# Patient Record
Sex: Male | Born: 1935 | Race: White | Hispanic: No | Marital: Married | Smoking: Former smoker
Health system: Southern US, Community
[De-identification: ages and names within clinical notes are randomized; demographics above are authoritative.]

## PROBLEM LIST (undated history)

## (undated) DIAGNOSIS — M1990 Unspecified osteoarthritis, unspecified site: Secondary | ICD-10-CM

## (undated) DIAGNOSIS — N400 Enlarged prostate without lower urinary tract symptoms: Secondary | ICD-10-CM

## (undated) DIAGNOSIS — Z96649 Presence of unspecified artificial hip joint: Secondary | ICD-10-CM

## (undated) DIAGNOSIS — J189 Pneumonia, unspecified organism: Secondary | ICD-10-CM

## (undated) DIAGNOSIS — C439 Malignant melanoma of skin, unspecified: Secondary | ICD-10-CM

## (undated) DIAGNOSIS — E119 Type 2 diabetes mellitus without complications: Secondary | ICD-10-CM

## (undated) DIAGNOSIS — E785 Hyperlipidemia, unspecified: Secondary | ICD-10-CM

## (undated) DIAGNOSIS — C8300 Small cell B-cell lymphoma, unspecified site: Secondary | ICD-10-CM

## (undated) HISTORY — PX: JOINT REPLACEMENT: SHX530

## (undated) HISTORY — DX: Malignant melanoma of skin, unspecified: C43.9

## (undated) HISTORY — DX: Benign prostatic hyperplasia without lower urinary tract symptoms: N40.0

## (undated) HISTORY — PX: ABDOMINAL AORTIC ANEURYSM REPAIR: SUR1152

## (undated) HISTORY — DX: Presence of unspecified artificial hip joint: Z96.649

## (undated) HISTORY — DX: Type 2 diabetes mellitus without complications: E11.9

## (undated) HISTORY — DX: Hyperlipidemia, unspecified: E78.5

## (undated) HISTORY — DX: Unspecified osteoarthritis, unspecified site: M19.90

## (undated) HISTORY — PX: NO PAST SURGERIES: SHX2092

---

## 2005-01-02 ENCOUNTER — Ambulatory Visit: Payer: Self-pay | Admitting: Gastroenterology

## 2006-03-10 ENCOUNTER — Ambulatory Visit: Payer: Self-pay | Admitting: Orthopaedic Surgery

## 2006-03-10 ENCOUNTER — Other Ambulatory Visit: Payer: Self-pay

## 2006-03-11 ENCOUNTER — Ambulatory Visit: Payer: Self-pay | Admitting: Orthopaedic Surgery

## 2008-03-21 ENCOUNTER — Ambulatory Visit: Payer: Self-pay | Admitting: Vascular Surgery

## 2008-03-21 ENCOUNTER — Ambulatory Visit: Payer: Self-pay | Admitting: Internal Medicine

## 2008-03-30 ENCOUNTER — Ambulatory Visit: Payer: Self-pay | Admitting: Vascular Surgery

## 2008-04-05 ENCOUNTER — Inpatient Hospital Stay: Payer: Self-pay | Admitting: Vascular Surgery

## 2008-05-24 ENCOUNTER — Ambulatory Visit: Payer: Self-pay | Admitting: Vascular Surgery

## 2008-05-25 ENCOUNTER — Inpatient Hospital Stay: Payer: Self-pay | Admitting: Vascular Surgery

## 2008-08-22 ENCOUNTER — Ambulatory Visit: Payer: Self-pay | Admitting: Gastroenterology

## 2011-02-03 ENCOUNTER — Ambulatory Visit: Payer: Self-pay | Admitting: General Surgery

## 2011-02-10 ENCOUNTER — Ambulatory Visit: Payer: Self-pay | Admitting: General Surgery

## 2012-08-26 ENCOUNTER — Ambulatory Visit: Payer: Self-pay | Admitting: Vascular Surgery

## 2012-08-26 LAB — BASIC METABOLIC PANEL
BUN: 18 mg/dL (ref 7–18)
Creatinine: 0.92 mg/dL (ref 0.60–1.30)
EGFR (African American): >60
EGFR (Non-African Amer.): >60
Glucose: 151 mg/dL — ABNORMAL HIGH (ref 65–99)
Osmolality: 277 (ref 275–301)
Potassium: 4.1 mmol/L (ref 3.5–5.1)
Sodium: 136 mmol/L (ref 136–145)

## 2012-09-08 ENCOUNTER — Ambulatory Visit: Payer: Self-pay | Admitting: Vascular Surgery

## 2012-09-08 LAB — BASIC METABOLIC PANEL
Anion Gap: 5 — ABNORMAL LOW (ref 7–16)
Chloride: 101 mmol/L (ref 98–107)
Co2: 31 mmol/L (ref 21–32)
Creatinine: 0.9 mg/dL (ref 0.60–1.30)
EGFR (Non-African Amer.): >60

## 2012-12-07 ENCOUNTER — Ambulatory Visit: Payer: Self-pay | Admitting: Unknown Physician Specialty

## 2013-03-15 ENCOUNTER — Ambulatory Visit: Payer: Self-pay | Admitting: Unknown Physician Specialty

## 2013-06-09 DIAGNOSIS — M659 Synovitis and tenosynovitis, unspecified: Secondary | ICD-10-CM | POA: Insufficient documentation

## 2013-12-19 DIAGNOSIS — Z87898 Personal history of other specified conditions: Secondary | ICD-10-CM | POA: Insufficient documentation

## 2014-04-06 DIAGNOSIS — M5440 Lumbago with sciatica, unspecified side: Secondary | ICD-10-CM | POA: Insufficient documentation

## 2014-04-06 DIAGNOSIS — Z96649 Presence of unspecified artificial hip joint: Secondary | ICD-10-CM | POA: Insufficient documentation

## 2014-04-14 ENCOUNTER — Ambulatory Visit
Admit: 2014-04-14 | Disposition: A | Discharge status: Home / Self Care | Payer: Self-pay | Attending: Unknown Physician Specialty | Admitting: Unknown Physician Specialty

## 2014-04-19 ENCOUNTER — Ambulatory Visit
Admit: 2014-04-19 | Disposition: A | Discharge status: Home / Self Care | Payer: Self-pay | Attending: Oncology | Admitting: Oncology

## 2014-04-20 LAB — CBC CANCER CENTER
BASOS ABS: 0.1 x10 3/mm (ref 0.0–0.1)
Basophil %: 0.6 %
EOS ABS: 1 x10 3/mm — AB (ref 0.0–0.7)
Eosinophil %: 10.1 %
HCT: 42.5 % (ref 40.0–52.0)
HGB: 14.1 g/dL (ref 13.0–18.0)
LYMPHS ABS: 1.1 x10 3/mm (ref 1.0–3.6)
Lymphocyte %: 11.1 %
MCH: 28.8 pg (ref 26.0–34.0)
MCHC: 33.3 g/dL (ref 32.0–36.0)
MCV: 87 fL (ref 80–100)
MONOS PCT: 8.5 %
Monocyte #: 0.8 x10 3/mm (ref 0.2–1.0)
NEUTROS PCT: 69.7 %
Neutrophil #: 6.7 x10 3/mm — ABNORMAL HIGH (ref 1.4–6.5)
PLATELETS: 234 x10 3/mm (ref 150–440)
RBC: 4.91 10*6/uL (ref 4.40–5.90)
RDW: 14.5 % (ref 11.5–14.5)
WBC: 9.6 x10 3/mm (ref 3.8–10.6)

## 2014-04-20 LAB — COMPREHENSIVE METABOLIC PANEL
AST: 24 U/L
Albumin: 3.4 g/dL — ABNORMAL LOW
Alkaline Phosphatase: 96 U/L
Anion Gap: 6 — ABNORMAL LOW (ref 7–16)
BUN: 22 mg/dL — ABNORMAL HIGH
Bilirubin,Total: 0.5 mg/dL
CALCIUM: 8.6 mg/dL — AB
CHLORIDE: 103 mmol/L
CO2: 28 mmol/L
Creatinine: 0.73 mg/dL
EGFR (Non-African Amer.): >60
GLUCOSE: 172 mg/dL — AB
Potassium: 4.9 mmol/L
SGPT (ALT): 18 U/L
SODIUM: 137 mmol/L
Total Protein: 6.6 g/dL

## 2014-04-21 LAB — URINE IEP, RANDOM

## 2014-04-24 LAB — PROT IMMUNOELECTROPHORES(ARMC)

## 2014-04-24 LAB — CANCER ANTIGEN 19-9: CA 19-9: 4 U/mL (ref 0–35)

## 2014-04-24 LAB — CEA: CEA: 3.5 ng/mL (ref 0.0–4.7)

## 2014-04-24 LAB — PSA: PSA: 4 ng/mL (ref 0.0–4.0)

## 2014-05-05 NOTE — Op Note (Signed)
PATIENT NAME:  David Beck, David Beck MR#:  [id number] DATE OF BIRTH:  06-25-1920  DATE OF PROCEDURE:  08/26/2012  PREOPERATIVE DIAGNOSES:  1.  Peripheral arterial disease with short distance claudication of the left lower extremity.  2.  Abdominal aortic aneurysm status post previous endovascular repair.  3.  Diabetes.  4.  Hypertension.   POSTOPERATIVE DIAGNOSES: 1.  Peripheral arterial disease with short distance claudication of the left lower extremity.  2.  Abdominal aortic aneurysm status post previous endovascular repair.  3.  Diabetes.  4.  Hypertension.   PROCEDURES:  1.  Ultrasound guidance for vascular access to the left radial artery.  2.  Catheter placement to left superficial femoral artery from right femoral approach.  3.  Aortogram and selective left lower extremity angiogram.  4.  Percutaneous transluminal angioplasty of proximal to mid left superficial femoral artery stenosis with a 5-mm diameter angioplasty balloon.   SURGEON:  Algernon Huxley, M.D.   ANESTHESIA:  Local with moderate conscious sedation.   ESTIMATED BLOOD LOSS:  25 mL.   INDICATION FOR PROCEDURE:  This is an 81 year old white male with worsening left lower extremity pain with minimal activity. He had previously had some claudication of his left lower extremity but over the past couple of weeks his pain has gotten worse and more severe. Noninvasive study showed a left SFA disease. Due to his previous abdominal aortic aneurysm repair, angiogram is going to be performed from a left upper extremity approach for further evaluation and potential treatment of his lesion. The risks and benefits were discussed. Informed consent was obtained.   DESCRIPTION OF THE PROCEDURE:  The patient was brought to the Vascular Interventional Radiology Suite. The left upper extremity is sterilely prepped and draped and a sterile surgical field was created. The left radial artery was accessed under direct ultrasound guidance in the mid  forearm with a micropuncture needle and a permanent image was recorded. A micropuncture wire and sheath were placed and upsized to a 5-French sheath. A pigtail catheter was then advanced, taken down the descending thoracic aorta and into the abdominal aorta just above the previously-placed stent graft. Aortogram was performed which showed a patent stent graft without flow-limiting stenosis in the aortoiliac segments. The renal arteries were patent. I then took a [place] advantage wire and advanced down to the left femoral head with a 135-cm angled catheter. This demonstrated stenosis of the proximal superficial femoral artery. There was then a reasonably normal segment of the SFA and then a second stenosis that was over about an 8 to 10 cm span and then resulted in a short segment occlusion. The distal superficial femoral artery then reconstituted. There appeared to be a moderate stenosis in the popliteal artery at the knee. A limited runoff distally had a normal tibial trifurcation. The 135 catheter got into the proximal superficial femoral artery. We then exchanged for an 0.018 wire. I was able to advance beyond the initial stenosis in the SFA but with the 150 cm catheter and 0.018 wire, I was not able to cross the total occlusion of the mid to distal superficial femoral artery. It was clear that this option was going to be futile, and we did not have stents that would reach that long had we crossed this lesion, only balloons, and with the heavily calcified lesion, even had I crossed this, I am not sure we could have provided revascularization options. I did elect to treat the more proximal lesion in the superficial femoral artery and  a 5-mm diameter angioplasty balloon was inflated in the proximal mid superficial femoral artery. A waist was taken, which resolved, and this initial stenosis was improved with angioplasty. The collateralization may have been improved by the stenosis, but I was unable to cross the  distal occlusion, and if he continues to have pain, we will have to come back from a different approach to treat his left lower extremity. At this point, a 4-0 Monocryl pursestring suture was placed around the sheath. The sheath was removed. Pressure was held. A sterile dressing was placed. The patient tolerated the procedure well and was taken to the Recovery Room in stable condition guidance.   ____________________________ Algernon Huxley, MD jsd:jm D: 08/26/2012 09:56:30 ET T: 08/26/2012 10:20:41 ET JOB#: [id number]  cc: Algernon Huxley, MD, <Dictator> Dion Body, MD Algernon Huxley MD ELECTRONICALLY SIGNED 08/30/2012 16:08

## 2014-05-05 NOTE — Op Note (Signed)
PATIENT NAME:  David Beck, David Beck MR#:  [id number] DATE OF BIRTH:  [birth year]  DATE OF PROCEDURE:  09/08/2012  PREOPERATIVE DIAGNOSES: 1.  Peripheral arterial disease with claudication, left lower extremity.  2.  Status post abdominal aortic aneurysm repair with Gore endovascular stent-graft.   POSTOPERATIVE DIAGNOSES:  1.  Peripheral arterial disease with claudication, left lower extremity.  2.  Status post abdominal aortic aneurysm repair with Gore endovascular stent-graft.   PROCEDURES: 1.  Ultrasound guidance for vascular access to left femoral artery.  2.  Catheter placement to left popliteal artery from left femoral approach.  3.  Left lower extremity angiogram.  4.  Percutaneous transluminal angioplasty of left superficial femoral artery occlusion with 6 mm diameter angioplasty balloon.  5.  Placement of a 6 mm diameter x 12 cm length self-expanding stent post dilated to 6 and 7 mm for greater than 50% residual stenosis after initial angioplasty.   SURGEON: Algernon Huxley, MD   ANESTHESIA: Local with moderate conscious sedation.   ESTIMATED BLOOD LOSS: Approximately 25 mL.   INDICATION FOR PROCEDURE: This is an 81 year old white male with severe peripheral vascular disease. He has short distance claudication of the left lower extremity. We have previously done an angiogram of the left arm.  His mid to distal left SFA occlusion was not amenable to treatment from the left arm approach, and we are attempting to cross this today from an antegrade approach and left femoral access.  The risks and benefits were discussed. Informed consent was obtained.   DESCRIPTION OF PROCEDURE: The patient was brought to the vascular interventional radiology suite. Left groin was sterilely prepped and draped, and a sterile surgical field was created. The left femoral artery was visualized with ultrasound and accessed under direct ultrasound guidance with a micropuncture needle. A micropuncture wire and sheath  were placed.  We then up-sized to a 5-French sheath. Imaging was performed of the proximal SFA which was treated with angioplasty last week, had no flow-limiting stenosis residual.  The distal SFA had a short segment occlusion that reconstituted just above Hunter's canal. His popliteal artery appeared to have a less than 50% stenosis, and he then had 2-vessel runoff distally. The patient was systemically heparinized. I up-sized to a 6-French sheath, was able to cross the lesion with a Terumo Advantage Wire and a Kumpe catheter with a mild amount of difficulty, confirming intraluminal flow in the popliteal artery. I then replaced the wire. Balloon angioplasty was performed with a 6 mm diameter angioplasty balloon. There was a very tight waist and heavy calcific lesion at the area of occlusion. Angiogram following angioplasty showed high-grade residual stenosis. I treated this with a 6 mm diameter x 12 cm length self-expanding stent.  I had to use a high-pressure 6 and then a 7 mm diameter angioplasty balloon to expand this to have a less than 30% residual stenosis. At this point, there was good in-line flow and a good angiographic completion result, and I elected to terminate the procedure. The sheath was removed. Pressure was held. Sterile dressing was placed. The patient tolerated the procedure well and was taken to the recovery room in stable condition.   ____________________________ Algernon Huxley, MD jsd:cb D: 09/08/2012 13:31:43 ET T: 09/08/2012 17:36:32 ET JOB#: [id number]  cc: Algernon Huxley, MD, <Dictator> Algernon Huxley MD ELECTRONICALLY SIGNED 09/15/2012 16:11

## 2014-05-07 NOTE — Op Note (Signed)
PATIENT NAME:  David Beck, BERWICK MR#:  [id number] DATE OF BIRTH:  November 09, 1920  DATE OF PROCEDURE:  02/10/2011  PREOPERATIVE DIAGNOSIS: Malignant melanoma, left distal forearm.   POSTOPERATIVE DIAGNOSIS: Malignant melanoma, left distal forearm.   OPERATIVE PROCEDURE: Wide excision with full-thickness skin graft, sentinel node biopsy.   SURGEON: Robert Bellow, MD  ANESTHESIA: General by LMA under Dr. Ronelle Nigh, Marcaine 0.5% plain, 30 mL local infiltration.   ESTIMATED BLOOD LOSS: Minimal.   CLINICAL NOTE: This 81 year old male was recently identified with a 0.8 mm malignant melanoma of the left forearm. Deep and peripheral margins were positive for residual tumor. He was felt to be a candidate for wide excision. As it was thought possible that this lesion might be 1 mm or greater it was elected to do a sentinel node biopsy at the same time. The patient was injected with technetium sulfur colloid by Crisoforo Oxford, M.D. in radiology approximately 2-1/2 hours prior to the procedure. Lymphoscintigraphy showed uptake in one gland in the left axilla.   OPERATIVE NOTE: With the patient under adequate general anesthesia, the axilla and arm were prepped with ChloraPrep and sterilely draped. The axilla was approached first. Skin lines were running longitudinally over the area of the increased uptake and a 6 x 2.5 cm full thickness skin biopsy was completed and saved on a saline dampened sponge. The deep tissue was entered transversely and a large 1.5 cm mildly hot lymph node was identified. Palpation showed no other additional nodes and scanning showed no other areas of increased uptake. This was sent in formalin for routine histology. The wound was closed with running 2-0 Vicryl deep. The deep adipose layer was then approximated with running 3-0 Vicryl. The skin was mobilized for less than 1 cm circumferentially and then approximated with running 3-0 Vicryl in the deep dermal layer and running 4-0 Vicryl  subcuticular suture for the skin. Benzoin, Steri-Strips, Telfa, and Tegaderm dressing was then applied.   Attention was turned to the forearm. The previously completed shave biopsy showed a clean still slightly open wound. It was elected to take margins 1 cm back from this making a 2.4 cm wide under 6 cm long wound. This was incised sharply after infiltration with Marcaine which was also used in the axilla for postoperative analgesia. The tissue was extended down well into the adipose layer but did not include the investing fascia. Hemostasis was with electrocautery. The defatted skin graft taken from the axilla was then brought into the field and anchored circumferentially with interrupted 3-0 silk sutures. A single 3-0 Vicryl suture was used to anchor it to the deep tissue to better approximate the graft to the wound bed. Prior to placing the graft the area was copiously irrigated to remove as much of methylene blue which had been instilled to help identify the sentinel node at the beginning of the procedure. A total of 3 mL of methylene blue diluted 1:2 with normal saline was used for the injection. Skin staples were then used for final approximation. The graft was compressed against the deep tissue followed by a layer of Adaptic, saline dampened cotton balls and then a bolster from the 3-0 silk sutures was created. Fluff gauze followed by Kerlix and a light Ace wrap was applied.   Patient tolerated the procedure well and was taken to recovery in stable condition.   ____________________________ Robert Bellow, MD jwb:cms D: 02/10/2011 13:52:28 ET T: 02/10/2011 14:04:53 ET JOB#: [id number]  cc: Robert Bellow, MD, <Dictator> Lucianne Muss  Netty Starring, MD Rayvon Char Dasher, MD Annelyse Rey Amedeo Kinsman MD ELECTRONICALLY SIGNED 02/10/2011 14:41

## 2014-05-08 ENCOUNTER — Ambulatory Visit
Admit: 2014-05-08 | Disposition: A | Discharge status: Home / Self Care | Payer: Self-pay | Attending: Internal Medicine | Admitting: Internal Medicine

## 2014-05-09 ENCOUNTER — Other Ambulatory Visit: Payer: Self-pay

## 2014-05-09 ENCOUNTER — Ambulatory Visit
Admit: 2014-05-09 | Disposition: A | Discharge status: Home / Self Care | Payer: Self-pay | Attending: Internal Medicine | Admitting: Internal Medicine

## 2014-05-19 ENCOUNTER — Telehealth: Payer: Self-pay | Admitting: *Deleted

## 2014-05-19 NOTE — Telephone Encounter (Signed)
Increase duragesic to 2 patches. Dr. Grayland Ormond is waiting for a call back from Dr. Reuel Derby regarding path and need for additional biopsy.

## 2014-05-19 NOTE — Telephone Encounter (Signed)
Continue to have significant pain with duragesic and norco use. Can he increase dose of duragesic? Patient and wife deny signs of oversedation.

## 2014-05-23 ENCOUNTER — Encounter: Payer: Self-pay | Admitting: Oncology

## 2014-05-23 ENCOUNTER — Inpatient Hospital Stay: Payer: Medicare Other | Attending: Oncology | Admitting: Oncology

## 2014-05-23 VITALS — BP 88/57 | HR 71 | Temp 97.2°F | Resp 16 | Wt 160.3 lb

## 2014-05-23 DIAGNOSIS — Z85820 Personal history of malignant melanoma of skin: Secondary | ICD-10-CM

## 2014-05-23 DIAGNOSIS — Z79899 Other long term (current) drug therapy: Secondary | ICD-10-CM | POA: Insufficient documentation

## 2014-05-23 DIAGNOSIS — E119 Type 2 diabetes mellitus without complications: Secondary | ICD-10-CM | POA: Diagnosis not present

## 2014-05-23 DIAGNOSIS — M1990 Unspecified osteoarthritis, unspecified site: Secondary | ICD-10-CM | POA: Insufficient documentation

## 2014-05-23 DIAGNOSIS — M549 Dorsalgia, unspecified: Secondary | ICD-10-CM | POA: Diagnosis not present

## 2014-05-23 DIAGNOSIS — M899 Disorder of bone, unspecified: Secondary | ICD-10-CM | POA: Diagnosis not present

## 2014-05-23 DIAGNOSIS — Z7952 Long term (current) use of systemic steroids: Secondary | ICD-10-CM | POA: Diagnosis not present

## 2014-05-23 DIAGNOSIS — C439 Malignant melanoma of skin, unspecified: Secondary | ICD-10-CM

## 2014-05-23 DIAGNOSIS — Z8679 Personal history of other diseases of the circulatory system: Secondary | ICD-10-CM | POA: Diagnosis not present

## 2014-05-23 DIAGNOSIS — E785 Hyperlipidemia, unspecified: Secondary | ICD-10-CM | POA: Insufficient documentation

## 2014-05-23 DIAGNOSIS — N400 Enlarged prostate without lower urinary tract symptoms: Secondary | ICD-10-CM | POA: Insufficient documentation

## 2014-05-23 DIAGNOSIS — Z87891 Personal history of nicotine dependence: Secondary | ICD-10-CM

## 2014-05-23 DIAGNOSIS — L989 Disorder of the skin and subcutaneous tissue, unspecified: Secondary | ICD-10-CM | POA: Diagnosis not present

## 2014-05-23 DIAGNOSIS — Z808 Family history of malignant neoplasm of other organs or systems: Secondary | ICD-10-CM | POA: Insufficient documentation

## 2014-05-23 NOTE — Progress Notes (Signed)
Patient's hip pain has not improved much with the Fentanyl patch increased to 2 Q72H.

## 2014-05-24 ENCOUNTER — Other Ambulatory Visit: Payer: Self-pay | Admitting: Interventional Radiology

## 2014-05-24 ENCOUNTER — Ambulatory Visit
Admission: RE | Admit: 2014-05-24 | Discharge: 2014-05-24 | Disposition: A | Discharge status: Home / Self Care | Payer: Medicare Other | Source: Ambulatory Visit | Attending: Oncology | Admitting: Oncology

## 2014-05-24 ENCOUNTER — Encounter
Admission: RE | Admit: 2014-05-24 | Discharge: 2014-05-24 | Disposition: A | Discharge status: Home / Self Care | Payer: Medicare Other | Source: Ambulatory Visit | Attending: Oncology | Admitting: Oncology

## 2014-05-24 DIAGNOSIS — C439 Malignant melanoma of skin, unspecified: Secondary | ICD-10-CM | POA: Insufficient documentation

## 2014-05-24 MED ORDER — TECHNETIUM TC 99M MEDRONATE IV KIT
22.0000 | PACK | Freq: Once | INTRAVENOUS | Status: AC | PRN
Start: 2014-05-24 — End: 2014-05-24
  Administered 2014-05-24: 23.089 via INTRAVENOUS

## 2014-05-25 ENCOUNTER — Ambulatory Visit
Admission: RE | Admit: 2014-05-25 | Discharge: 2014-05-25 | Disposition: A | Discharge status: Home / Self Care | Payer: Medicare Other | Source: Ambulatory Visit | Attending: Oncology | Admitting: Oncology

## 2014-05-25 DIAGNOSIS — Z85820 Personal history of malignant melanoma of skin: Secondary | ICD-10-CM | POA: Diagnosis present

## 2014-05-25 DIAGNOSIS — M899 Disorder of bone, unspecified: Secondary | ICD-10-CM | POA: Insufficient documentation

## 2014-05-25 DIAGNOSIS — C439 Malignant melanoma of skin, unspecified: Secondary | ICD-10-CM

## 2014-05-25 HISTORY — DX: Pneumonia, unspecified organism: J18.9

## 2014-05-25 LAB — CBC
HEMATOCRIT: 43.8 % (ref 40.0–52.0)
HEMOGLOBIN: 14.1 g/dL (ref 13.0–18.0)
MCH: 27.7 pg (ref 26.0–34.0)
MCHC: 32.2 g/dL (ref 32.0–36.0)
MCV: 86.1 fL (ref 80.0–100.0)
Platelets: 192 10*3/uL (ref 150–440)
RBC: 5.09 MIL/uL (ref 4.40–5.90)
RDW: 14.7 % — ABNORMAL HIGH (ref 11.5–14.5)
WBC: 11.5 10*3/uL — ABNORMAL HIGH (ref 3.8–10.6)

## 2014-05-25 LAB — DIFFERENTIAL
BASOS PCT: 1 %
Basophils Absolute: 0.1 10*3/uL (ref 0–0.1)
EOS PCT: 3 %
Eosinophils Absolute: 0.3 10*3/uL (ref 0–0.7)
LYMPHS PCT: 4 %
Lymphs Abs: 0.4 10*3/uL — ABNORMAL LOW (ref 1.0–3.6)
MONO ABS: 0.8 10*3/uL (ref 0.2–1.0)
Monocytes Relative: 7 %
Neutro Abs: 10 10*3/uL — ABNORMAL HIGH (ref 1.4–6.5)
Neutrophils Relative %: 87 %

## 2014-05-25 LAB — APTT: aPTT: 27 seconds (ref 24–36)

## 2014-05-25 LAB — PROTIME-INR
INR: 1.06
PROTHROMBIN TIME: 14 s (ref 11.4–15.0)

## 2014-05-25 MED ORDER — HEPARIN SOD (PORK) LOCK FLUSH 100 UNIT/ML IV SOLN
500.0000 [IU] | Freq: Once | INTRAVENOUS | Status: DC
  Filled 2014-05-25: qty 5

## 2014-05-25 MED ORDER — MIDAZOLAM HCL 5 MG/5ML IJ SOLN
INTRAMUSCULAR | Status: AC | PRN
  Administered 2014-05-25: 0.5 mg via INTRAVENOUS
  Administered 2014-05-25: 1 mg via INTRAVENOUS
  Administered 2014-05-25: 0.5 mg via INTRAVENOUS

## 2014-05-25 MED ORDER — SODIUM CHLORIDE 0.9 % IV SOLN
INTRAVENOUS | Status: DC
  Administered 2014-05-25: 09:00:00 via INTRAVENOUS

## 2014-05-25 MED ORDER — HYDROCODONE-ACETAMINOPHEN 5-325 MG PO TABS: 1.0000 | ORAL_TABLET | ORAL | Status: DC | PRN

## 2014-05-25 MED ORDER — FENTANYL CITRATE (PF) 100 MCG/2ML IJ SOLN
INTRAMUSCULAR | Status: AC | PRN
  Administered 2014-05-25: 50 ug via INTRAVENOUS
  Administered 2014-05-25: 25 ug via INTRAVENOUS

## 2014-05-25 NOTE — Procedures (Signed)
Interventional Radiology Procedure Note  Procedure: CT guided aspirate and core biopsy of right iliac bone lesion Complications: None Recommendations: - Bedrest supine x 2 hrs - Hydrocodone PRN  Pain - Follow biopsy results  Signed,  Criselda Peaches, MD

## 2014-05-30 ENCOUNTER — Ambulatory Visit: Payer: Medicare Other | Admitting: Oncology

## 2014-05-30 ENCOUNTER — Ambulatory Visit: Admission: RE | Admit: 2014-05-30 | Payer: Medicare Other | Source: Ambulatory Visit

## 2014-05-31 LAB — SURGICAL PATHOLOGY

## 2014-06-01 ENCOUNTER — Other Ambulatory Visit: Payer: Self-pay | Admitting: *Deleted

## 2014-06-01 ENCOUNTER — Inpatient Hospital Stay (HOSPITAL_BASED_OUTPATIENT_CLINIC_OR_DEPARTMENT_OTHER): Payer: Medicare Other | Admitting: Oncology

## 2014-06-01 VITALS — BP 98/66 | HR 66 | Temp 96.5°F | Resp 20

## 2014-06-01 DIAGNOSIS — E785 Hyperlipidemia, unspecified: Secondary | ICD-10-CM | POA: Diagnosis not present

## 2014-06-01 DIAGNOSIS — E119 Type 2 diabetes mellitus without complications: Secondary | ICD-10-CM | POA: Diagnosis not present

## 2014-06-01 DIAGNOSIS — M899 Disorder of bone, unspecified: Secondary | ICD-10-CM | POA: Diagnosis not present

## 2014-06-01 DIAGNOSIS — Z85820 Personal history of malignant melanoma of skin: Secondary | ICD-10-CM

## 2014-06-01 DIAGNOSIS — Z7952 Long term (current) use of systemic steroids: Secondary | ICD-10-CM

## 2014-06-01 DIAGNOSIS — M549 Dorsalgia, unspecified: Secondary | ICD-10-CM | POA: Diagnosis not present

## 2014-06-01 DIAGNOSIS — Z79899 Other long term (current) drug therapy: Secondary | ICD-10-CM

## 2014-06-01 DIAGNOSIS — C8330 Diffuse large B-cell lymphoma, unspecified site: Secondary | ICD-10-CM

## 2014-06-01 DIAGNOSIS — M1990 Unspecified osteoarthritis, unspecified site: Secondary | ICD-10-CM

## 2014-06-01 DIAGNOSIS — Z8679 Personal history of other diseases of the circulatory system: Secondary | ICD-10-CM

## 2014-06-01 DIAGNOSIS — N400 Enlarged prostate without lower urinary tract symptoms: Secondary | ICD-10-CM

## 2014-06-01 MED ORDER — PREDNISONE 50 MG PO TABS: ORAL_TABLET | ORAL | Status: AC

## 2014-06-01 MED ORDER — OXYCODONE HCL 10 MG PO TABS: 10.0000 mg | ORAL_TABLET | Freq: Four times a day (QID) | ORAL | Status: DC | PRN

## 2014-06-02 NOTE — Patient Instructions (Signed)
Rituximab injection  What is this medicine?  RITUXIMAB (ri TUX i mab) is a monoclonal antibody. This medicine changes the way the body's immune system works. It is used commonly to treat non-Hodgkin's lymphoma and other conditions. In cancer cells, this drug targets a specific protein within cancer cells and stops the cancer cells from growing. It is also used to treat rhuematoid arthritis (RA). In RA, this medicine slow the inflammatory process and help reduce joint pain and swelling. This medicine is often used with other cancer or arthritis medications.  This medicine may be used for other purposes; ask your health care provider or pharmacist if you have questions.  COMMON BRAND NAME(S): Rituxan  What should I tell my health care provider before I take this medicine?  They need to know if you have any of these conditions:  -blood disorders  -heart disease  -history of hepatitis B  -infection (especially a virus infection such as chickenpox, cold sores, or herpes)  -irregular heartbeat  -kidney disease  -lung or breathing disease, like asthma  -lupus  -an unusual or allergic reaction to rituximab, mouse proteins, other medicines, foods, dyes, or preservatives  -pregnant or trying to get pregnant  -breast-feeding  How should I use this medicine?  This medicine is for infusion into a vein. It is administered in a hospital or clinic by a specially trained health care professional.  A special MedGuide will be given to you by the pharmacist with each prescription and refill. Be sure to read this information carefully each time.  Talk to your pediatrician regarding the use of this medicine in children. This medicine is not approved for use in children.  Overdosage: If you think you have taken too much of this medicine contact a poison control center or emergency room at once.  NOTE: This medicine is only for you. Do not share this medicine with others.  What if I miss a dose?  It is important not to miss a dose. Call  your doctor or health care professional if you are unable to keep an appointment.  What may interact with this medicine?  -cisplatin  -medicines for blood pressure  -some other medicines for arthritis  -vaccines  This list may not describe all possible interactions. Give your health care provider a list of all the medicines, herbs, non-prescription drugs, or dietary supplements you use. Also tell them if you smoke, drink alcohol, or use illegal drugs. Some items may interact with your medicine.  What should I watch for while using this medicine?  Report any side effects that you notice during your treatment right away, such as changes in your breathing, fever, chills, dizziness or lightheadedness. These effects are more common with the first dose.  Visit your prescriber or health care professional for checks on your progress. You will need to have regular blood work. Report any other side effects. The side effects of this medicine can continue after you finish your treatment. Continue your course of treatment even though you feel ill unless your doctor tells you to stop.  Call your doctor or health care professional for advice if you get a fever, chills or sore throat, or other symptoms of a cold or flu. Do not treat yourself. This drug decreases your body's ability to fight infections. Try to avoid being around people who are sick.  This medicine may increase your risk to bruise or bleed. Call your doctor or health care professional if you notice any unusual bleeding.  Be careful   brushing and flossing your teeth or using a toothpick because you may get an infection or bleed more easily. If you have any dental work done, tell your dentist you are receiving this medicine.  Avoid taking products that contain aspirin, acetaminophen, ibuprofen, naproxen, or ketoprofen unless instructed by your doctor. These medicines may hide a fever.  Do not become pregnant while taking this medicine. Women should inform their doctor  if they wish to become pregnant or think they might be pregnant. There is a potential for serious side effects to an unborn child. Talk to your health care professional or pharmacist for more information. Do not breast-feed an infant while taking this medicine.  What side effects may I notice from receiving this medicine?  Side effects that you should report to your doctor or health care professional as soon as possible:  -allergic reactions like skin rash, itching or hives, swelling of the face, lips, or tongue  -low blood counts - this medicine may decrease the number of white blood cells, red blood cells and platelets. You may be at increased risk for infections and bleeding.  -signs of infection - fever or chills, cough, sore throat, pain or difficulty passing urine  -signs of decreased platelets or bleeding - bruising, pinpoint red spots on the skin, black, tarry stools, blood in the urine  -signs of decreased red blood cells - unusually weak or tired, fainting spells, lightheadedness  -breathing problems  -confused, not responsive  -chest pain  -fast, irregular heartbeat  -feeling faint or lightheaded, falls  -mouth sores  -redness, blistering, peeling or loosening of the skin, including inside the mouth  -stomach pain  -swelling of the ankles, feet, or hands  -trouble passing urine or change in the amount of urine  Side effects that usually do not require medical attention (report to your doctor or other health care professional if they continue or are bothersome):  -anxiety  -headache  -loss of appetite  -muscle aches  -nausea  -night sweats  This list may not describe all possible side effects. Call your doctor for medical advice about side effects. You may report side effects to FDA at 1-800-FDA-1088.  Where should I keep my medicine?  This drug is given in a hospital or clinic and will not be stored at home.  NOTE: This sheet is a summary. It may not cover all possible information. If you have questions  about this medicine, talk to your doctor, pharmacist, or health care provider.  © 2015, [employer]. (2007-08-30 14:04:59)  Cyclophosphamide injection  What is this medicine?  CYCLOPHOSPHAMIDE (sye kloe FOSS fa mide) is a chemotherapy drug. It slows the growth of cancer cells. This medicine is used to treat many types of cancer like lymphoma, myeloma, leukemia, breast cancer, and ovarian cancer, to name a few.  This medicine may be used for other purposes; ask your health care provider or pharmacist if you have questions.  COMMON BRAND NAME(S): Cytoxan, Neosar  What should I tell my health care provider before I take this medicine?  They need to know if you have any of these conditions:  -blood disorders  -history of other chemotherapy  -infection  -kidney disease  -liver disease  -recent or ongoing radiation therapy  -tumors in the bone marrow  -an unusual or allergic reaction to cyclophosphamide, other chemotherapy, other medicines, foods, dyes, or preservatives  -pregnant or trying to get pregnant  -breast-feeding  How should I use this medicine?  This drug is usually given   as an injection into a vein or muscle or by infusion into a vein. It is administered in a hospital or clinic by a specially trained health care professional.  Talk to your pediatrician regarding the use of this medicine in children. Special care may be needed.  Overdosage: If you think you have taken too much of this medicine contact a poison control center or emergency room at once.  NOTE: This medicine is only for you. Do not share this medicine with others.  What if I miss a dose?  It is important not to miss your dose. Call your doctor or health care professional if you are unable to keep an appointment.  What may interact with this medicine?  This medicine may interact with the following medications:  -amiodarone  -amphotericin B  -azathioprine  -certain antiviral medicines for HIV or AIDS such as protease inhibitors (e.g.,  indinavir, ritonavir) and zidovudine  -certain blood pressure medications such as benazepril, captopril, enalapril, fosinopril, lisinopril, moexipril, monopril, perindopril, quinapril, ramipril, trandolapril  -certain cancer medications such as anthracyclines (e.g., daunorubicin, doxorubicin), busulfan, cytarabine, paclitaxel, pentostatin, tamoxifen, trastuzumab  -certain diuretics such as chlorothiazide, chlorthalidone, hydrochlorothiazide, indapamide, metolazone  -certain medicines that treat or prevent blood clots like warfarin  -certain muscle relaxants such as succinylcholine  -cyclosporine  -etanercept  -indomethacin  -medicines to increase blood counts like filgrastim, pegfilgrastim, sargramostim  -medicines used as general anesthesia  -metronidazole  -natalizumab  This list may not describe all possible interactions. Give your health care provider a list of all the medicines, herbs, non-prescription drugs, or dietary supplements you use. Also tell them if you smoke, drink alcohol, or use illegal drugs. Some items may interact with your medicine.  What should I watch for while using this medicine?  Visit your doctor for checks on your progress. This drug may make you feel generally unwell. This is not uncommon, as chemotherapy can affect healthy cells as well as cancer cells. Report any side effects. Continue your course of treatment even though you feel ill unless your doctor tells you to stop.  Drink water or other fluids as directed. Urinate often, even at night.  In some cases, you may be given additional medicines to help with side effects. Follow all directions for their use.  Call your doctor or health care professional for advice if you get a fever, chills or sore throat, or other symptoms of a cold or flu. Do not treat yourself. This drug decreases your body's ability to fight infections. Try to avoid being around people who are sick.  This medicine may increase your risk to bruise or bleed. Call  your doctor or health care professional if you notice any unusual bleeding.  Be careful brushing and flossing your teeth or using a toothpick because you may get an infection or bleed more easily. If you have any dental work done, tell your dentist you are receiving this medicine.  You may get drowsy or dizzy. Do not drive, use machinery, or do anything that needs mental alertness until you know how this medicine affects you.  Do not become pregnant while taking this medicine or for 1 year after stopping it. Women should inform their doctor if they wish to become pregnant or think they might be pregnant. Men should not father a child while taking this medicine and for 4 months after stopping it. There is a potential for serious side effects to an unborn child. Talk to your health care professional or pharmacist for more information.   Do not breast-feed an infant while taking this medicine.  This medicine may interfere with the ability to have a child. This medicine has caused ovarian failure in some women. This medicine has caused reduced sperm counts in some men. You should talk with your doctor or health care professional if you are concerned about your fertility.  If you are going to have surgery, tell your doctor or health care professional that you have taken this medicine.  What side effects may I notice from receiving this medicine?  Side effects that you should report to your doctor or health care professional as soon as possible:  -allergic reactions like skin rash, itching or hives, swelling of the face, lips, or tongue  -low blood counts - this medicine may decrease the number of white blood cells, red blood cells and platelets. You may be at increased risk for infections and bleeding.  -signs of infection - fever or chills, cough, sore throat, pain or difficulty passing urine  -signs of decreased platelets or bleeding - bruising, pinpoint red spots on the skin, black, tarry stools, blood in the  urine  -signs of decreased red blood cells - unusually weak or tired, fainting spells, lightheadedness  -breathing problems  -dark urine  -dizziness  -palpitations  -swelling of the ankles, feet, hands  -trouble passing urine or change in the amount of urine  -weight gain  -yellowing of the eyes or skin  Side effects that usually do not require medical attention (report to your doctor or health care professional if they continue or are bothersome):  -changes in nail or skin color  -hair loss  -missed menstrual periods  -mouth sores  -nausea, vomiting  This list may not describe all possible side effects. Call your doctor for medical advice about side effects. You may report side effects to FDA at 1-800-FDA-1088.  Where should I keep my medicine?  This drug is given in a hospital or clinic and will not be stored at home.  NOTE: This sheet is a summary. It may not cover all possible information. If you have questions about this medicine, talk to your doctor, pharmacist, or health care provider.  © 2015, [employer]. (2011-11-14 16:22:58)  Vincristine injection  What is this medicine?  VINCRISTINE (vin KRIS teen) is a chemotherapy drug. It slows the growth of cancer cells. This medicine is used to treat many types of cancer like Hodgkin's disease, leukemia, non-Hodgkin's lymphoma, neuroblastoma (brain cancer), rhabdomyosarcoma, and Wilms' tumor.  This medicine may be used for other purposes; ask your health care provider or pharmacist if you have questions.  COMMON BRAND NAME(S): Oncovin, Vincasar PFS  What should I tell my health care provider before I take this medicine?  They need to know if you have any of these conditions:  -blood disorders  -gout  -infection (especially chickenpox, cold sores, or herpes)  -kidney disease  -liver disease  -lung disease  -nervous system disease like Charcot-Marie-Tooth (CMT)  -recent or ongoing radiation therapy  -an unusual or allergic reaction to vincristine, other  chemotherapy agents, other medicines, foods, dyes, or preservatives  -pregnant or trying to get pregnant  -breast-feeding  How should I use this medicine?  This drug is given as an infusion into a vein. It is administered in a hospital or clinic by a specially trained health care professional. If you have pain, swelling, burning, or any unusual feeling around the site of your injection, tell your health care professional right away.  Talk to your   pediatrician regarding the use of this medicine in children. While this drug may be prescribed for selected conditions, precautions do apply.  Overdosage: If you think you have taken too much of this medicine contact a poison control center or emergency room at once.  NOTE: This medicine is only for you. Do not share this medicine with others.  What if I miss a dose?  It is important not to miss your dose. Call your doctor or health care professional if you are unable to keep an appointment.  What may interact with this medicine?  Do not take this medicine with any of the following medications:  -itraconazole  -mibefradil  -voriconazole  This medicine may also interact with the following medications:  -cyclosporine  -erythromycin  -fluconazole  -ketoconazole  -medicines for HIV like delavirdine, efavirenz, nevirapine  -medicines for seizures like ethotoin, fosphenotoin, phenytoin  -medicines to increase blood counts like filgrastim, pegfilgrastim, sargramostim  -other chemotherapy drugs like cisplatin, L-asparaginase, methotrexate, mitomycin, paclitaxel  -pegaspargase  -vaccines  -zalcitabine, ddC  Talk to your doctor or health care professional before taking any of these medicines:  -acetaminophen  -aspirin  -ibuprofen  -ketoprofen  -naproxen  This list may not describe all possible interactions. Give your health care provider a list of all the medicines, herbs, non-prescription drugs, or dietary supplements you use. Also tell them if you smoke, drink alcohol, or use  illegal drugs. Some items may interact with your medicine.  What should I watch for while using this medicine?  Your condition will be monitored carefully while you are receiving this medicine. You will need important blood work done while you are taking this medicine.  This drug may make you feel generally unwell. This is not uncommon, as chemotherapy can affect healthy cells as well as cancer cells. Report any side effects. Continue your course of treatment even though you feel ill unless your doctor tells you to stop.  In some cases, you may be given additional medicines to help with side effects. Follow all directions for their use.  Call your doctor or health care professional for advice if you get a fever, chills or sore throat, or other symptoms of a cold or flu. Do not treat yourself.  Avoid taking products that contain aspirin, acetaminophen, ibuprofen, naproxen, or ketoprofen unless instructed by your doctor. These medicines may hide a fever.  Do not become pregnant while taking this medicine. Women should inform their doctor if they wish to become pregnant or think they might be pregnant. There is a potential for serious side effects to an unborn child. Talk to your health care professional or pharmacist for more information. Do not breast-feed an infant while taking this medicine.  Men may have a lower sperm count while taking this medicine. Talk to your doctor if you plan to father a child.  What side effects may I notice from receiving this medicine?  Side effects that you should report to your doctor or health care professional as soon as possible:  -allergic reactions like skin rash, itching or hives, swelling of the face, lips, or tongue  -breathing problems  -confusion or changes in emotions or moods  -constipation  -cough  -mouth sores  -muscle weakness  -nausea and vomiting  -pain, swelling, redness or irritation at the injection site  -pain, tingling, numbness in the hands or feet  -problems  with balance, talking, walking  -seizures  -stomach pain  -trouble passing urine or change in the amount of urine  Side   effects that usually do not require medical attention (report to your doctor or health care professional if they continue or are bothersome):  -diarrhea  -hair loss  -jaw pain  -loss of appetite  This list may not describe all possible side effects. Call your doctor for medical advice about side effects. You may report side effects to FDA at 1-800-FDA-1088.  Where should I keep my medicine?  This drug is given in a hospital or clinic and will not be stored at home.  NOTE: This sheet is a summary. It may not cover all possible information. If you have questions about this medicine, talk to your doctor, pharmacist, or health care provider.  © 2015, [employer]. (2007-09-27 17:17:13)

## 2014-06-06 ENCOUNTER — Ambulatory Visit: Payer: Medicare Other

## 2014-06-06 LAB — CYTOLOGY - NON PAP

## 2014-06-07 ENCOUNTER — Telehealth: Payer: Self-pay | Admitting: *Deleted

## 2014-06-07 DIAGNOSIS — C8330 Diffuse large B-cell lymphoma, unspecified site: Secondary | ICD-10-CM | POA: Insufficient documentation

## 2014-06-07 NOTE — Telephone Encounter (Signed)
That should be ok, he can go ahead and take it.

## 2014-06-07 NOTE — Telephone Encounter (Signed)
Went to see PMD today who gave rx for Remeron for appetite and weight loss. They were reading info pon it and see that it causes decreased wbc and knowing that chemo is going to do the same was asking if he should take it and if not asking for something else be called in from you for appetite

## 2014-06-08 ENCOUNTER — Ambulatory Visit
Admission: RE | Admit: 2014-06-08 | Discharge: 2014-06-08 | Disposition: A | Discharge status: Home / Self Care | Payer: Medicare Other | Source: Ambulatory Visit | Attending: Vascular Surgery | Admitting: Vascular Surgery

## 2014-06-08 ENCOUNTER — Encounter
Admission: RE | Disposition: A | Discharge status: Home / Self Care | Payer: Self-pay | Source: Ambulatory Visit | Attending: Vascular Surgery

## 2014-06-08 ENCOUNTER — Encounter: Payer: Self-pay | Admitting: *Deleted

## 2014-06-08 ENCOUNTER — Other Ambulatory Visit: Payer: Medicare Other

## 2014-06-08 DIAGNOSIS — E785 Hyperlipidemia, unspecified: Secondary | ICD-10-CM | POA: Diagnosis not present

## 2014-06-08 DIAGNOSIS — Z87891 Personal history of nicotine dependence: Secondary | ICD-10-CM | POA: Diagnosis not present

## 2014-06-08 DIAGNOSIS — Z96649 Presence of unspecified artificial hip joint: Secondary | ICD-10-CM | POA: Insufficient documentation

## 2014-06-08 DIAGNOSIS — Z79899 Other long term (current) drug therapy: Secondary | ICD-10-CM | POA: Diagnosis not present

## 2014-06-08 DIAGNOSIS — E119 Type 2 diabetes mellitus without complications: Secondary | ICD-10-CM | POA: Diagnosis not present

## 2014-06-08 DIAGNOSIS — C8590 Non-Hodgkin lymphoma, unspecified, unspecified site: Secondary | ICD-10-CM | POA: Diagnosis present

## 2014-06-08 HISTORY — PX: PERIPHERAL VASCULAR CATHETERIZATION: SHX172C

## 2014-06-08 SURGERY — PORTA CATH INSERTION
Anesthesia: Moderate Sedation

## 2014-06-08 MED ORDER — CEFAZOLIN SODIUM 1-5 GM-% IV SOLN
INTRAVENOUS | Status: AC
  Filled 2014-06-08: qty 50

## 2014-06-08 MED ORDER — FENTANYL CITRATE (PF) 100 MCG/2ML IJ SOLN
INTRAMUSCULAR | Status: AC
  Filled 2014-06-08: qty 2

## 2014-06-08 MED ORDER — ATROPINE SULFATE 0.1 MG/ML IJ SOLN: 0.5000 mg | Freq: Once | INTRAMUSCULAR | Status: DC | PRN

## 2014-06-08 MED ORDER — DEXTROSE-NACL 5-0.9 % IV SOLN: INTRAVENOUS | Status: DC

## 2014-06-08 MED ORDER — CEFAZOLIN SODIUM 1-5 GM-% IV SOLN
1.0000 g | Freq: Once | INTRAVENOUS | Status: AC
  Administered 2014-06-08: 1 g via INTRAVENOUS

## 2014-06-08 MED ORDER — SODIUM CHLORIDE 0.9 % IR SOLN
80.0000 mg | Freq: Once | Status: AC
  Administered 2014-06-08: 80 mg
  Filled 2014-06-08: qty 2

## 2014-06-08 MED ORDER — MIDAZOLAM HCL 2 MG/2ML IJ SOLN
INTRAMUSCULAR | Status: DC | PRN
  Administered 2014-06-08: 1 mg via INTRAVENOUS
  Administered 2014-06-08: 2 mg via INTRAVENOUS

## 2014-06-08 MED ORDER — SODIUM CHLORIDE 0.9 % IV SOLN
INTRAVENOUS | Status: DC
  Administered 2014-06-08: 11:00:00 via INTRAVENOUS

## 2014-06-08 MED ORDER — ONDANSETRON HCL 4 MG/2ML IJ SOLN: 4.0000 mg | INTRAMUSCULAR | Status: DC | PRN

## 2014-06-08 MED ORDER — MIDAZOLAM HCL 5 MG/5ML IJ SOLN
INTRAMUSCULAR | Status: AC
  Filled 2014-06-08: qty 5

## 2014-06-08 MED ORDER — LIDOCAINE-EPINEPHRINE (PF) 1 %-1:200000 IJ SOLN
INTRAMUSCULAR | Status: AC
  Filled 2014-06-08: qty 30

## 2014-06-08 MED ORDER — HEPARIN (PORCINE) IN NACL 2-0.9 UNIT/ML-% IJ SOLN
INTRAMUSCULAR | Status: AC
  Filled 2014-06-08: qty 500

## 2014-06-08 MED ORDER — HYDROMORPHONE HCL 1 MG/ML IJ SOLN: 1.0000 mg | INTRAMUSCULAR | Status: DC | PRN

## 2014-06-08 MED ORDER — FENTANYL CITRATE (PF) 100 MCG/2ML IJ SOLN
INTRAMUSCULAR | Status: DC | PRN
  Administered 2014-06-08 (×2): 50 ug via INTRAVENOUS

## 2014-06-08 SURGICAL SUPPLY — 3 items
PACK ANGIOGRAPHY (CUSTOM PROCEDURE TRAY) ×3 IMPLANT
PORTACATH POWER 8F (Port) ×3 IMPLANT
TOWEL OR 17X26 4PK STRL BLUE (TOWEL DISPOSABLE) ×3 IMPLANT

## 2014-06-08 NOTE — H&P (Signed)
[facility] Admission History & Physical  MRN : [id number]  David Beck is a 81 y.o. (1920/03/17) male who presents with chief complaint of back and leg pain.  History of Present Illness: Patient is a 81 year old male who has a recently diagnosed malignancy. He needs a Port-A-Cath for durable chemotherapy and venous access. He is scheduled to start treatments soon. He complains of back and leg pain for several months to years now. He was found to have metastatic lesion in his spine that is likely causing some of the pain. He has a known history of aneurysm status post repair. He also has known peripheral vascular disease.  Current Facility-Administered Medications  Medication Dose Route Frequency Provider Last Rate Last Dose  . 0.9 %  sodium chloride infusion   Intravenous Continuous Algernon Huxley, MD 75 mL/hr at 06/08/14 1042    . atropine 0.1 MG/ML injection 0.5 mg  0.5 mg Intravenous Once PRN Algernon Huxley, MD      . ceFAZolin (ANCEF) IVPB 1 g/50 mL premix  1 g Intravenous Once Algernon Huxley, MD      . dextrose 5 %-0.9 % sodium chloride infusion   Intravenous Continuous Algernon Huxley, MD      . gentamicin (GARAMYCIN) 80 mg in sodium chloride irrigation 0.9 % 500 mL irrigation  80 mg Irrigation Once Algernon Huxley, MD      . HYDROmorphone (DILAUDID) injection 1 mg  1 mg Intravenous Q30 min PRN Algernon Huxley, MD      . ondansetron [facility]) injection 4 mg  4 mg Intravenous Q30 min PRN Algernon Huxley, MD        Past Medical History  Diagnosis Date  . Diabetes mellitus without complication   . DJD (degenerative joint disease)   . BPH (benign prostatic hyperplasia)   . Hyperlipidemia   . History of hip replacement   . Pneumonia   . Melanoma     Past Surgical History  Procedure Laterality Date  . No past surgeries    . Abdominal aortic aneurysm repair      4 years ago  . Joint replacement      Social History History  Substance Use Topics  . Smoking status: Former  Smoker    Types: Cigarettes    Quit date: 01/14/1978  . Smokeless tobacco: Not on file  . Alcohol Use: No    Family History Family History  Problem Relation Age of Onset  . Stomach cancer Mother   . Melanoma Brother   . Diabetes Brother   . CVA Brother     Allergies  Allergen Reactions  . No Known Allergies      REVIEW OF SYSTEMS (Negative unless checked)  Constitutional: [x] Weight loss  [] Fever  [] Chills Cardiac: [] Chest pain   [] Chest pressure   [] Palpitations   [] Shortness of breath when laying flat   [] Shortness of breath at rest   [] Shortness of breath with exertion. Vascular:  [x] Pain in legs with walking   [x] Pain in legs at rest   [] Pain in legs when laying flat   [] Claudication   [] Pain in feet when walking  [] Pain in feet at rest  [] Pain in feet when laying flat   [] History of DVT   [] Phlebitis   [] Swelling in legs   [] Varicose veins   [] Non-healing ulcers Pulmonary:   [] Uses home oxygen   [] Productive cough   [] Hemoptysis   [] Wheeze  [] COPD   [] Asthma Neurologic:  []   Dizziness  [] Blackouts   [] Seizures   [] History of stroke   [] History of TIA  [] Aphasia   [] Temporary blindness   [] Dysphagia   [] Weakness or numbness in arms   [] Weakness or numbness in legs Musculoskeletal:  [x] Arthritis   [] Joint swelling   [x] Joint pain   [x] Low back pain Hematologic:  [] Easy bruising  [] Easy bleeding   [] Hypercoagulable state   [] Anemic  [] Hepatitis Gastrointestinal:  [] Blood in stool   [] Vomiting blood  [] Gastroesophageal reflux/heartburn   [] Difficulty swallowing. Genitourinary:  [] Chronic kidney disease   [] Difficult urination  [] Frequent urination  [] Burning with urination   [] Blood in urine Skin:  [] Rashes   [] Ulcers   [] Wounds Psychological:  [] History of anxiety   []  History of major depression.  Physical Examination  Filed Vitals:   06/08/14 1034  BP: 94/72  Pulse: 83  Temp: 97.6 F (36.4 C)  Resp: 18  SpO2: 95%   There is no weight on file to calculate BMI.  Head:  [place]/AT. Prominent temp pulse not noted. Ear/Nose/Throat: Hearing grossly intact, nares w/o erythema or drainage, oropharynx w/o Erythema/Exudate,  Eyes: PERRLA, EOMI.  Neck: Supple, no nuchal rigidity.  No bruit or JVD.  Pulmonary:  Good air movement, clear to auscultation bilaterally, no use of accessory muscles.  Cardiac: RRR, normal S1, S2, no Murmurs, rubs or gallops. Vascular: Vessel Right Left  Radial Palpable Palpable  Ulnar Palpable Palpable  Brachial Palpable Palpable  Carotid Palpable, without bruit Palpable, without bruit  Aorta Not palpable N/A  Femoral Palpable Palpable  Popliteal Palpable  not Palpable  PT Palpable  weakly Palpable  DP  not Palpable  not Palpable   Gastrointestinal: soft, non-tender/non-distended. No guarding/reflex.  Musculoskeletal: M/S 5/5 throughout.  Extremities without ischemic changes.  No deformity or atrophy.  Neurologic: CN 2-12 intact. Pain and light touch intact in extremities.  Symmetrical.  Speech is fluent. Motor exam as listed above. Psychiatric: Judgment intact, Mood & affect appropriate for pt's clinical situation. Dermatologic: No rashes or ulcers noted.  No cellulitis or open wounds. Lymph : No Cervical, Axillary, or Inguinal lymphadenopathy.      CBC Lab Results  Component Value Date   WBC 11.5* 05/25/2014   HGB 14.1 05/25/2014   HCT 43.8 05/25/2014   MCV 86.1 05/25/2014   PLT 192 05/25/2014    BMET    Component Value Date/Time   NA 137 04/20/2014 1010   K 4.9 04/20/2014 1010   CL 103 04/20/2014 1010   CO2 28 04/20/2014 1010   GLUCOSE 172* 04/20/2014 1010   BUN 22* 04/20/2014 1010   CREATININE 0.73 04/20/2014 1010   CALCIUM 8.6* 04/20/2014 1010   GFRNONAA >60 04/20/2014 1010   GFRAA >60 04/20/2014 1010   CrCl cannot be calculated (Unknown ideal weight.).  COAG Lab Results  Component Value Date   INR 1.06 05/25/2014      Assessment/Plan Recently diagnosed malignancy with spinal metastases. Needs a  Port-A-Cath for chemotherapy and we will place this today. Risks and benefits discussed.  Abdominal aortic aneurysm, status post repair and stable. Peripheral arterial disease, stable. Back and leg pain, likely from metastatic malignancy. Arthritis PAD may be contributing factors although he does not describe typical claudication symptoms.   DEW,JASON, MD  06/08/2014 11:47 AM

## 2014-06-08 NOTE — Op Note (Signed)
Preoperative diagnosis:  1. Lymphoma, bone involvement in back  Postoperative diagnosis:  Same as above  Procedures: #1. Ultrasound guidance for vascular access to the right internal jugular vein. #2. Fluoroscopic guidance for placement of catheter. #3. Placement of CT compatible Port-A-Cath, right internal jugular vein.  Surgeon: Leotis Pain, MD.   Anesthesia: Local with moderate conscious sedation.  Fluoroscopy time: less than 1 minute  Contrast used: 0  Estimated blood loss: minimal  Indication for the procedure:  Patient is a 81 yo WM with recently diagnosed lymphoma.  The patient needs a Port-A-Cath for durable venous access, chemotherapy, lab draws, and CT scans. We are asked to place this. Risks and benefits were discussed and informed consent was obtained.  Description of procedure: The patient was brought to the vascular and interventional radiology suite. The right neck chest and shoulder were sterilely prepped and draped, and a sterile surgical field was created. Ultrasound was used to help visualize a patent right internal jugular vein. This was then accessed under direct ultrasound guidance without difficulty with the Seldinger needle and a permanent image was recorded. A J-wire was placed. After skin nick and dilatation, the peel-away sheath was then placed over the wire. I then anesthetized an area under the clavicle approximately 1-2 fingerbreadths. A transverse incision was created and an inferior pocket was created with electrocautery and blunt dissection. The port was then brought onto the field, placed into the pocket and secured to the chest wall with 2 Prolene sutures. The catheter was connected to the port and tunneled from the subclavicular incision to the access site. Fluoroscopic guidance was then used to cut the catheter to an appropriate length. The catheter was then placed through the peel-away sheath and the peel-away sheath was removed. The catheter tip was parked in  excellent location under fluorocoscopic guidance in the SVC just above the right atrium. The pocket was then irrigated with antibiotic impregnated saline and the wound was closed with a running 3-0 Vicryl and a 4-0 Monocryl. The access incision was closed with a single 4-0 Monocryl. The Huber needle was used to withdraw blood and flush the port with heparinized saline. Dermabond was then placed as a dressing. The patient tolerated the procedure well and was taken to the recovery room in stable condition.   DEW,JASON

## 2014-06-08 NOTE — Telephone Encounter (Signed)
Notified wife ok to take med

## 2014-06-09 ENCOUNTER — Ambulatory Visit: Payer: Medicare Other

## 2014-06-09 ENCOUNTER — Encounter: Payer: Self-pay | Admitting: Vascular Surgery

## 2014-06-09 NOTE — Progress Notes (Signed)
[facility]  Telephone:([phone]) [phone] Fax:(336) [phone]  ID: David Beck OB: 1920/08/20  MR#: [id number]  GEZ#:[id number]  Patient Care Team: Dion Body, MD as PCP - General (Family Medicine) Algernon Huxley, MD as Consulting Physician (Vascular Surgery)  CHIEF COMPLAINT:  Chief Complaint  Patient presents with  . Follow-up    Discuss test results    INTERVAL HISTORY: Patient returns to clinic today for further evaluation, discussion of his imaging results, and diagnosis planning. Recently EBUS results were inconclusive. Currently, patient feels well. His pain is improved on his current narcotic regimen. He has no neurologic complaints. He denies any recent fevers or illnesses. He has a good appetite and denies weight loss. He denies any dysphasia. He denies any chest pain, cough, or shortness of breath. He denies any nausea, vomiting, constipation, or diarrhea. He has no urinary complaints. Patient offers no further specific complaints today.  REVIEW OF SYSTEMS:   Review of Systems  Constitutional: Negative.   Respiratory: Negative.   Cardiovascular: Negative.   Musculoskeletal: Positive for back pain.    As per HPI. Otherwise, a complete review of systems is negatve.  PAST MEDICAL HISTORY: Past Medical History  Diagnosis Date  . Diabetes mellitus without complication   . DJD (degenerative joint disease)   . BPH (benign prostatic hyperplasia)   . Hyperlipidemia   . History of hip replacement   . Pneumonia   . Melanoma     PAST SURGICAL HISTORY: Past Surgical History  Procedure Laterality Date  . No past surgeries    . Abdominal aortic aneurysm repair      4 years ago  . Joint replacement    . Peripheral vascular catheterization N/A 06/08/2014    Procedure: Glori Luis Cath Insertion;  Surgeon: Algernon Huxley, MD;  Location: [place] [facility];  Service: Cardiovascular;  Laterality: N/A;    FAMILY HISTORY Family History  Problem Relation Age of  Onset  . Stomach cancer Mother   . Melanoma Brother   . Diabetes Brother   . CVA Brother        ADVANCED DIRECTIVES:    HEALTH MAINTENANCE: History  Substance Use Topics  . Smoking status: Former Smoker    Types: Cigarettes    Quit date: 01/14/1978  . Smokeless tobacco: Not on file  . Alcohol Use: No     Colonoscopy:  PAP:  Bone density:  Lipid panel:  Allergies  Allergen Reactions  . No Known Allergies     Current Outpatient Prescriptions  Medication Sig Dispense Refill  . fentaNYL (DURAGESIC - DOSED MCG/HR) 12 MCG/HR Place 12.5 mcg onto the skin every 3 (three) days. 2 patches at a time    . glipiZIDE (GLUCOTROL) 10 MG tablet Take 10 mg by mouth.    Marland Kitchen HYDROcodone-acetaminophen (NORCO/VICODIN) 5-325 MG per tablet     . Oxycodone HCl 10 MG TABS Take 1 tablet (10 mg total) by mouth every 6 (six) hours as needed. 60 tablet 0  . predniSONE (DELTASONE) 50 MG tablet Take 1 tablet daily for 5 days with each chemotherapy treatment 30 tablet 2   No current facility-administered medications for this visit.    OBJECTIVE: Filed Vitals:   05/23/14 1156  BP: 88/57  Pulse: 71  Temp: 97.2 F (36.2 C)  Resp: 16     There is no height on file to calculate BMI.    ECOG FS:1 - Symptomatic but completely ambulatory  General: Well-developed, well-nourished, no acute distress. Eyes: anicteric sclera. Lungs:  Clear to auscultation bilaterally. Heart: Regular rate and rhythm. No rubs, murmurs, or gallops. Abdomen: Soft, nontender, nondistended. No organomegaly noted, normoactive bowel sounds. Musculoskeletal: No edema, cyanosis, or clubbing. Neuro: Alert, answering all questions appropriately. Cranial nerves grossly intact. Skin: No rashes or petechiae noted. Psych: Normal affect.    LAB RESULTS:  Lab Results  Component Value Date   NA 137 04/20/2014   K 4.9 04/20/2014   CL 103 04/20/2014   CO2 28 04/20/2014   GLUCOSE 172* 04/20/2014   BUN 22* 04/20/2014   CREATININE  0.73 04/20/2014   CALCIUM 8.6* 04/20/2014   PROT 6.6 04/20/2014   ALBUMIN 3.4* 04/20/2014   AST 24 04/20/2014   ALT 18 04/20/2014   ALKPHOS 96 04/20/2014   GFRNONAA >60 04/20/2014   GFRAA >60 04/20/2014    Lab Results  Component Value Date   WBC 11.5* 05/25/2014   NEUTROABS 10.0* 05/25/2014   HGB 14.1 05/25/2014   HCT 43.8 05/25/2014   MCV 86.1 05/25/2014   PLT 192 05/25/2014     STUDIES: Nm Bone Scan Whole Body  05/24/2014   CLINICAL DATA:  History of metastatic melanoma to the right leg and right arm, now with back pain radiating to the left hip, left hip replacement in February 2015  EXAM: [place] SCAN  TECHNIQUE: Whole body anterior and posterior images were obtained approximately 3 hours after intravenous injection of radiopharmaceutical.  RADIOPHARMACEUTICALS:  23.1 mCi Technetium-16mMDP IV  COMPARISON:  CT chest of 04/28/2014, MR lumbar spine of 04/14/2014, CT hips of 03/15/2013 and MR lumbar spine of 12/07/2012  FINDINGS: The patient was injected with 23.1 mCi of technetium 969M MDP intravenously and total body bone scan was performed. There is a focus of increased activity representing the entire L2 vertebral body. On recent MR, there is a pathological fracture of the L2 vertebral body. However, the multitude of additional bony metastases noted on that MR lumbar spine recently are not visible on today's bone scan. There is some activity surrounding the neck of the left hip replacement which could indicate some prosthetic motion. Correlation with plain films is recommended. Vague activity overlies the right femoral greater trochanter but no definite abnormality is seen correlating with recent CT of the abdomen pelvis on bone window images. No other abnormal activity is evident.  IMPRESSION: 1. The only focus of increased activity corresponds to the pathologically compressed L2 vertebral body. The remainder of the myriad bone metastases demonstrated by MRI are  not visible by today's bone scan. 2. Some linear activity surrounds the neck of the left femoral prosthesis and prosthetic motion would be a consideration.   Electronically Signed   By: PIvar DrapeM.D.   On: 05/24/2014 13:41   Ct Biopsy  05/25/2014   CLINICAL DATA:  81year old male with a clinical history of melanoma and a multifocal metastases on MRI. CT-guided biopsy for tissue diagnosis.  EXAM: CT BIOPSY  Date: 05/25/2014  PROCEDURE: 1. CT guided bone marrow aspiration and core biopsy Interventional Radiologist:  HCriselda Peaches MD  ANESTHESIA/SEDATION: Moderate (conscious) sedation was used. 2 mg Versed, 75 mcg Fentanyl were administered intravenously. The patient's vital signs were monitored continuously by radiology nursing throughout the procedure.  Sedation Time: 30 minutes  TECHNIQUE: Informed consent was obtained from the patient following explanation of the procedure, risks, benefits and alternatives. The patient understands, agrees and consents for the procedure. All questions were addressed. A time out was performed.  The patient was positioned  prone and noncontrast localization CT was performed of the pelvis to demonstrate the iliac marrow spaces.  On the right, there is extremely subtle relatively increased attenuation within the marrow space corresponding anatomically with the location of the metastatic lesion seen on prior MRI. Of note, these abnormalities are nearly in visible by CT imaging. Using the MRI scan as a reference, a suitable skin entry site was selected using anatomic markers. The region was then sterilely prepped and draped in standard fashion using chlorhexidine skin prep. Local anesthesia was attained by infiltration of 1% lidocaine.  Under sterile conditions and local anesthesia, an 11 gauge coaxial bone biopsy needle was advanced into the right iliac marrow space. Needle position was confirmed with CT imaging. Initially, bone marrow aspiration was performed and injected  into formalin. Next, the 11 gauge outer cannula was utilized to obtain a right iliac bone core biopsy. The core biopsy was then repeated to insure adequate sampling. The on Control drill system was utilized for both core biopsies. Needle was removed. Hemostasis was obtained with compression. The patient tolerated the procedure well. No immediate complications.  IMPRESSION: 1. CT-guided right iliac bone biopsy and aspiration. Unfortunately, the lesions which are so well seen on MRI are nearly completely occult by CT imaging. The region of the lesions was targeted using anatomic landmarks and the prior MRI for comparison. Signed,  Criselda Peaches, MD  [facility]  [facility]   Electronically Signed   By: Jacqulynn Cadet M.D.   On: 05/25/2014 15:36    ASSESSMENT: Lesion seen on MRI and CT scan suspicious for metastatic disease.  PLAN:    1. Suspicious lesions: Highly concerning for metastatic disease. Recent biopsy of mediastinal nodes was inconclusive. Will now proceed with CT-guided biopsy of one of his bone lesions, likely in his pelvis. Previously, all tumor markers including PSA and SPEP are negative or within normal limits. Return to clinic in 3-4 days after his biopsy further evaluation and treatment planning if necessary.  Approximately 30 minutes was spent in discussion and consultation.  Patient expressed understanding and was in agreement with this plan. He also understands that He can call clinic at any time with any questions, concerns, or complaints.   No matching staging information was found for the patient.  Lloyd Huger, MD   06/09/2014 4:01 PM

## 2014-06-13 ENCOUNTER — Ambulatory Visit
Admission: RE | Admit: 2014-06-13 | Discharge: 2014-06-13 | Disposition: A | Discharge status: Home / Self Care | Payer: Medicare Other | Source: Ambulatory Visit | Attending: Oncology | Admitting: Oncology

## 2014-06-13 DIAGNOSIS — M4856XA Collapsed vertebra, not elsewhere classified, lumbar region, initial encounter for fracture: Secondary | ICD-10-CM | POA: Diagnosis not present

## 2014-06-13 DIAGNOSIS — C7951 Secondary malignant neoplasm of bone: Secondary | ICD-10-CM | POA: Diagnosis not present

## 2014-06-13 DIAGNOSIS — C8330 Diffuse large B-cell lymphoma, unspecified site: Secondary | ICD-10-CM

## 2014-06-13 DIAGNOSIS — R161 Splenomegaly, not elsewhere classified: Secondary | ICD-10-CM | POA: Insufficient documentation

## 2014-06-13 DIAGNOSIS — J439 Emphysema, unspecified: Secondary | ICD-10-CM | POA: Diagnosis not present

## 2014-06-13 DIAGNOSIS — R590 Localized enlarged lymph nodes: Secondary | ICD-10-CM | POA: Diagnosis not present

## 2014-06-13 LAB — GLUCOSE, CAPILLARY: GLUCOSE-CAPILLARY: 126 mg/dL — AB (ref 65–99)

## 2014-06-13 MED ORDER — FLUDEOXYGLUCOSE F - 18 (FDG) INJECTION
11.9400 | Freq: Once | INTRAVENOUS | Status: AC | PRN
  Administered 2014-06-13: 11.94 via INTRAVENOUS

## 2014-06-14 ENCOUNTER — Other Ambulatory Visit: Payer: Self-pay | Admitting: Oncology

## 2014-06-14 DIAGNOSIS — C8330 Diffuse large B-cell lymphoma, unspecified site: Secondary | ICD-10-CM

## 2014-06-15 ENCOUNTER — Inpatient Hospital Stay: Payer: Medicare Other

## 2014-06-15 ENCOUNTER — Inpatient Hospital Stay: Payer: Medicare Other | Attending: Oncology

## 2014-06-15 ENCOUNTER — Inpatient Hospital Stay (HOSPITAL_BASED_OUTPATIENT_CLINIC_OR_DEPARTMENT_OTHER): Payer: Medicare Other | Admitting: Oncology

## 2014-06-15 VITALS — BP 102/53 | HR 70 | Resp 18

## 2014-06-15 VITALS — BP 66/48 | HR 144 | Temp 98.5°F | Resp 18 | Wt 142.4 lb

## 2014-06-15 DIAGNOSIS — I959 Hypotension, unspecified: Secondary | ICD-10-CM

## 2014-06-15 DIAGNOSIS — R5383 Other fatigue: Secondary | ICD-10-CM | POA: Diagnosis not present

## 2014-06-15 DIAGNOSIS — R630 Anorexia: Secondary | ICD-10-CM | POA: Diagnosis not present

## 2014-06-15 DIAGNOSIS — R5381 Other malaise: Secondary | ICD-10-CM | POA: Insufficient documentation

## 2014-06-15 DIAGNOSIS — N400 Enlarged prostate without lower urinary tract symptoms: Secondary | ICD-10-CM | POA: Diagnosis not present

## 2014-06-15 DIAGNOSIS — E860 Dehydration: Secondary | ICD-10-CM | POA: Insufficient documentation

## 2014-06-15 DIAGNOSIS — Z79899 Other long term (current) drug therapy: Secondary | ICD-10-CM | POA: Insufficient documentation

## 2014-06-15 DIAGNOSIS — R634 Abnormal weight loss: Secondary | ICD-10-CM | POA: Insufficient documentation

## 2014-06-15 DIAGNOSIS — E119 Type 2 diabetes mellitus without complications: Secondary | ICD-10-CM | POA: Insufficient documentation

## 2014-06-15 DIAGNOSIS — R197 Diarrhea, unspecified: Secondary | ICD-10-CM | POA: Diagnosis not present

## 2014-06-15 DIAGNOSIS — C8330 Diffuse large B-cell lymphoma, unspecified site: Secondary | ICD-10-CM | POA: Insufficient documentation

## 2014-06-15 DIAGNOSIS — Z85820 Personal history of malignant melanoma of skin: Secondary | ICD-10-CM | POA: Insufficient documentation

## 2014-06-15 DIAGNOSIS — M549 Dorsalgia, unspecified: Secondary | ICD-10-CM | POA: Diagnosis not present

## 2014-06-15 DIAGNOSIS — R531 Weakness: Secondary | ICD-10-CM | POA: Diagnosis not present

## 2014-06-15 DIAGNOSIS — Z87891 Personal history of nicotine dependence: Secondary | ICD-10-CM | POA: Diagnosis not present

## 2014-06-15 DIAGNOSIS — M1990 Unspecified osteoarthritis, unspecified site: Secondary | ICD-10-CM

## 2014-06-15 DIAGNOSIS — E785 Hyperlipidemia, unspecified: Secondary | ICD-10-CM

## 2014-06-15 DIAGNOSIS — Z5111 Encounter for antineoplastic chemotherapy: Secondary | ICD-10-CM | POA: Insufficient documentation

## 2014-06-15 DIAGNOSIS — Z8679 Personal history of other diseases of the circulatory system: Secondary | ICD-10-CM | POA: Diagnosis not present

## 2014-06-15 DIAGNOSIS — Z800 Family history of malignant neoplasm of digestive organs: Secondary | ICD-10-CM | POA: Diagnosis not present

## 2014-06-15 LAB — CBC WITH DIFFERENTIAL/PLATELET
Basophils Absolute: 0.1 10*3/uL (ref 0–0.1)
Basophils Relative: 1 %
Eosinophils Absolute: 0.7 10*3/uL (ref 0–0.7)
Eosinophils Relative: 7 %
HCT: 45.7 % (ref 40.0–52.0)
HEMOGLOBIN: 14.9 g/dL (ref 13.0–18.0)
Lymphocytes Relative: 6 %
Lymphs Abs: 0.6 10*3/uL — ABNORMAL LOW (ref 1.0–3.6)
MCH: 27.7 pg (ref 26.0–34.0)
MCHC: 32.5 g/dL (ref 32.0–36.0)
MCV: 85.2 fL (ref 80.0–100.0)
Monocytes Absolute: 0.8 10*3/uL (ref 0.2–1.0)
Monocytes Relative: 8 %
NEUTROS ABS: 8 10*3/uL — AB (ref 1.4–6.5)
Neutrophils Relative %: 78 %
Platelets: 131 10*3/uL — ABNORMAL LOW (ref 150–440)
RBC: 5.37 MIL/uL (ref 4.40–5.90)
RDW: 14.6 % — ABNORMAL HIGH (ref 11.5–14.5)
WBC: 10.2 10*3/uL (ref 3.8–10.6)

## 2014-06-15 LAB — COMPREHENSIVE METABOLIC PANEL
ALT: 21 U/L (ref 17–63)
AST: 47 U/L — ABNORMAL HIGH (ref 15–41)
Albumin: 3.2 g/dL — ABNORMAL LOW (ref 3.5–5.0)
Alkaline Phosphatase: 104 U/L (ref 38–126)
Anion gap: 9 (ref 5–15)
BILIRUBIN TOTAL: 0.9 mg/dL (ref 0.3–1.2)
BUN: 19 mg/dL (ref 6–20)
CO2: 27 mmol/L (ref 22–32)
CREATININE: 0.8 mg/dL (ref 0.61–1.24)
Calcium: 8.6 mg/dL — ABNORMAL LOW (ref 8.9–10.3)
Chloride: 100 mmol/L — ABNORMAL LOW (ref 101–111)
GFR calc Af Amer: >60 mL/min (ref >60)
GFR calc non Af Amer: >60 mL/min (ref >60)
GLUCOSE: 109 mg/dL — AB (ref 65–99)
Potassium: 3.8 mmol/L (ref 3.5–5.1)
Sodium: 136 mmol/L (ref 135–145)
Total Protein: 5.7 g/dL — ABNORMAL LOW (ref 6.5–8.1)

## 2014-06-15 MED ORDER — LIDOCAINE-PRILOCAINE 2.5-2.5 % EX CREA: 1.0000 "application " | TOPICAL_CREAM | CUTANEOUS | Status: AC | PRN

## 2014-06-15 MED ORDER — SODIUM CHLORIDE 0.9 % IV SOLN
Freq: Once | INTRAVENOUS | Status: AC
  Administered 2014-06-15: 11:00:00 via INTRAVENOUS
  Filled 2014-06-15: qty 1000

## 2014-06-15 MED ORDER — HEPARIN SOD (PORK) LOCK FLUSH 100 UNIT/ML IV SOLN
500.0000 [IU] | Freq: Once | INTRAVENOUS | Status: AC
  Administered 2014-06-15: 500 [IU] via INTRAVENOUS
  Filled 2014-06-15: qty 5

## 2014-06-15 MED ORDER — SODIUM CHLORIDE 0.9 % IJ SOLN
10.0000 mL | Freq: Once | INTRAMUSCULAR | Status: AC
  Administered 2014-06-15: 10 mL via INTRAVENOUS
  Filled 2014-06-15: qty 10

## 2014-06-15 MED ORDER — MEGESTROL ACETATE 40 MG/ML PO SUSP: 400.0000 mg | Freq: Every day | ORAL | Status: AC

## 2014-06-16 NOTE — Progress Notes (Signed)
[place]  Telephone:(336) [phone] Fax:(336) [phone]  ID: David Beck OB: [birth year]  MR#: [id number]  TIR#:[id number]  Patient Care Team: Dion Body, MD as PCP - General (Family Medicine) Algernon Huxley, MD as Consulting Physician (Vascular Surgery)  CHIEF COMPLAINT:  Chief Complaint  Patient presents with  . Follow-up    biopsy results    INTERVAL HISTORY: Patient returns to clinic today for further evaluation, discussion of his pathology results, and treatment planning. Currently, patient feels well. His pain is improved on his current narcotic regimen. He has no neurologic complaints. He denies any recent fevers or illnesses. He has a good appetite and denies weight loss. He denies any dysphasia. He denies any chest pain, cough, or shortness of breath. He denies any nausea, vomiting, constipation, or diarrhea. He has no urinary complaints. Patient offers no specific complaints today.  REVIEW OF SYSTEMS:   Review of Systems  Constitutional: Positive for malaise/fatigue.  Respiratory: Negative.   Cardiovascular: Negative.   Neurological: Positive for weakness.    As per HPI. Otherwise, a complete review of systems is negatve.  PAST MEDICAL HISTORY: Past Medical History  Diagnosis Date  . Diabetes mellitus without complication   . DJD (degenerative joint disease)   . BPH (benign prostatic hyperplasia)   . Hyperlipidemia   . History of hip replacement   . Pneumonia   . Melanoma     PAST SURGICAL HISTORY: Past Surgical History  Procedure Laterality Date  . No past surgeries    . Abdominal aortic aneurysm repair      4 years ago  . Joint replacement    . Peripheral vascular catheterization N/A 06/08/2014    Procedure: Glori Luis Cath Insertion;  Surgeon: Algernon Huxley, MD;  Location: [facility] [facility];  Service: Cardiovascular;  Laterality: N/A;    FAMILY HISTORY Family History  Problem Relation Age of Onset  . Stomach cancer Mother   .  Melanoma Brother   . Diabetes Brother   . CVA Brother        ADVANCED DIRECTIVES:    HEALTH MAINTENANCE: History  Substance Use Topics  . Smoking status: Former Smoker    Types: Cigarettes    Quit date: 01/14/1978  . Smokeless tobacco: Not on file  . Alcohol Use: No     Colonoscopy:  PAP:  Bone density:  Lipid panel:  Allergies  Allergen Reactions  . No Known Allergies     Current Outpatient Prescriptions  Medication Sig Dispense Refill  . glipiZIDE (GLUCOTROL) 10 MG tablet Take 10 mg by mouth.    Marland Kitchen HYDROcodone-acetaminophen (NORCO/VICODIN) 5-325 MG per tablet     . fentaNYL (DURAGESIC - DOSED MCG/HR) 12 MCG/HR Place 12.5 mcg onto the skin every 3 (three) days. 2 patches at a time    . lidocaine-prilocaine (EMLA) cream Apply 1 application topically as needed. Apply to port area 1-2 hours prior to chemotherapy treatment appointment, cover with plastic wrap. 30 g 1  . lovastatin (MEVACOR) 20 MG tablet     . megestrol (MEGACE) 40 MG/ML suspension Take 10 mLs (400 mg total) by mouth daily. 240 mL 2  . mirtazapine (REMERON) 15 MG tablet     . Oxycodone HCl 10 MG TABS Take 1 tablet (10 mg total) by mouth every 6 (six) hours as needed. 60 tablet 0  . predniSONE (DELTASONE) 50 MG tablet Take 1 tablet daily for 5 days with each chemotherapy treatment 30 tablet 2   No current facility-administered medications for  this visit.    OBJECTIVE: Filed Vitals:   06/01/14 1152  BP: 98/66  Pulse: 66  Temp: 96.5 F (35.8 C)  Resp: 20     There is no weight on file to calculate BMI.    ECOG FS:1 - Symptomatic but completely ambulatory  General: Well-developed, well-nourished, no acute distress. Eyes: anicteric sclera. Lungs: Clear to auscultation bilaterally. Heart: Regular rate and rhythm. No rubs, murmurs, or gallops. Abdomen: Soft, nontender, nondistended. No organomegaly noted, normoactive bowel sounds. Musculoskeletal: No edema, cyanosis, or clubbing. Neuro: Alert,  answering all questions appropriately. Cranial nerves grossly intact. Skin: No rashes or petechiae noted. Psych: Normal affect.    LAB RESULTS:  Lab Results  Component Value Date   NA 136 06/15/2014   K 3.8 06/15/2014   CL 100* 06/15/2014   CO2 27 06/15/2014   GLUCOSE 109* 06/15/2014   BUN 19 06/15/2014   CREATININE 0.80 06/15/2014   CALCIUM 8.6* 06/15/2014   PROT 5.7* 06/15/2014   ALBUMIN 3.2* 06/15/2014   AST 47* 06/15/2014   ALT 21 06/15/2014   ALKPHOS 104 06/15/2014   BILITOT 0.9 06/15/2014   GFRNONAA >60 06/15/2014   GFRAA >60 06/15/2014    Lab Results  Component Value Date   WBC 10.2 06/15/2014   NEUTROABS 8.0* 06/15/2014   HGB 14.9 06/15/2014   HCT 45.7 06/15/2014   MCV 85.2 06/15/2014   PLT 131* 06/15/2014     STUDIES: Nm Bone Scan Whole Body  05/24/2014   CLINICAL DATA:  History of metastatic melanoma to the right leg and right arm, now with back pain radiating to the left hip, left hip replacement in February 2015  EXAM: [place] SCAN  TECHNIQUE: Whole body anterior and posterior images were obtained approximately 3 hours after intravenous injection of radiopharmaceutical.  RADIOPHARMACEUTICALS:  23.1 mCi Technetium-58mMDP IV  COMPARISON:  CT chest of 04/28/2014, MR lumbar spine of 04/14/2014, CT hips of 03/15/2013 and MR lumbar spine of 12/07/2012  FINDINGS: The patient was injected with 23.1 mCi of technetium 977M MDP intravenously and total body bone scan was performed. There is a focus of increased activity representing the entire L2 vertebral body. On recent MR, there is a pathological fracture of the L2 vertebral body. However, the multitude of additional bony metastases noted on that MR lumbar spine recently are not visible on today's bone scan. There is some activity surrounding the neck of the left hip replacement which could indicate some prosthetic motion. Correlation with plain films is recommended. Vague activity overlies the  right femoral greater trochanter but no definite abnormality is seen correlating with recent CT of the abdomen pelvis on bone window images. No other abnormal activity is evident.  IMPRESSION: 1. The only focus of increased activity corresponds to the pathologically compressed L2 vertebral body. The remainder of the myriad bone metastases demonstrated by MRI are not visible by today's bone scan. 2. Some linear activity surrounds the neck of the left femoral prosthesis and prosthetic motion would be a consideration.   Electronically Signed   By: PIvar DrapeM.D.   On: 05/24/2014 13:41   Nm Pet Image Initial (pi) Skull Base To Thigh  06/13/2014   CLINICAL DATA:  Initial Treatment strategy for diffuse large B-cell lymphoma.  EXAM: NUCLEAR MEDICINE PET SKULL BASE TO THIGH  TECHNIQUE: 11.9 mCi F-18 FDG was injected intravenously. Full-ring PET imaging was performed from the skull base to thigh after the radiotracer. CT data was obtained and used  for attenuation correction and anatomic localization.  FASTING BLOOD GLUCOSE:  Value: 126 mg/dl  COMPARISON:  Multiple exams, including 04/28/2014  FINDINGS: NECK  A right station 2 lymph node measuring 1 cm in short axis has maximum standard uptake value 7.4. A left supraclavicular lymph node measuring 1.0 cm in short axis on image 56 series 3 has maximum standard uptake value 7.7. Multiple additional small hypermetabolic internal jugular, posterior cervical triangle, and left submandibular lymph nodes are present.  CHEST  Hypermetabolic bilateral axillary, mediastinal, subpectoral, and hilar adenopathy noted. Index subcarinal lymph node has maximum standard uptake value 10.1 and measures 1.7 cm in short axis on image 100 series 3. An index left axillary lymph node measures 1.4 cm in short axis on image 73 series 3 and has maximum standard uptake value 5.5. An index prevascular lymph node measures 1.7 cm in short axis on image 81 series 3 and has maximum standard uptake value  5.1.  Focal indistinct consolidation in the lingula measuring 1.6 by 1.2 cm has maximum standard uptake value 4.8. Confluent interstitial accentuation with some faint nodularity observed in the right upper lobe and right middle lobe, activity in this region with a maximum standard uptake value of about 2.4.  Underlying severe emphysema. Pleural fluid or pleural thickening at the left lung base without significant hypermetabolic activity.  A 7 mm nodule in the left lower lobe on image 122 series 3 is not appreciably hypermetabolic but is also below sensitive PET-CT size thresholds. Ground-glass opacities are present in both lower lobes with some mild nodularity.  ABDOMEN/PELVIS  Splenomegaly with extensive splenic hypermetabolic activity, maximum standard uptake value approximately 8.7. Posterior splenic hypo metabolism probably from infarct. Small hypermetabolic porta hepatis lymph nodes. A left periaortic node measuring 1.5 cm on image 172 series 3 has maximum standard uptake value 6.7. Additional hypermetabolic periaortic, right common iliac, and bilateral external iliac adenopathy is present. A left inguinal lymph node measuring 1.0 cm in short axis on image 276 series 3 has maximum standard uptake value 5.1.  Aorta bi-iliac stent.  SKELETON  Widespread osseous metastatic disease including the thoracic spine, lumbar spine, and bony pelvis. There is a compression fracture at L2 with paraspinal hypermetabolic tissue suggesting a potentially malignant compression fracture with paraspinal tumor it is difficult in this case to exclude a small amount of epidural tumor.  IMPRESSION: 1. Splenomegaly with markedly increased splenic activity compatible with splenic involvement by lymphoma. There is also hypermetabolic adenopathy scattered throughout the chest, neck, abdomen, and pelvis. 2. Osseous metastatic disease primarily involving the axial skeleton. Pathologic compression fracture at L2 with paraspinal tumor- early  epidural tumor cannot be readily excluded. 3. Emphysema with some focal fairly high metabolic activity associated with nodularity in the lingula, which could be due to will malignant infiltration of the long or granulomatous process. The confluent abnormal interstitial accentuation in the right upper lobe and right middle lobe is more typical in activity for inflammatory process.   Electronically Signed   By: Van Clines M.D.   On: 06/13/2014 12:38   Ct Biopsy  05/25/2014   CLINICAL DATA:  81 year old male with a clinical history of melanoma and a multifocal metastases on MRI. CT-guided biopsy for tissue diagnosis.  EXAM: CT BIOPSY  Date: 05/25/2014  PROCEDURE: 1. CT guided bone marrow aspiration and core biopsy Interventional Radiologist:  Criselda Peaches, MD  ANESTHESIA/SEDATION: Moderate (conscious) sedation was used. 2 mg Versed, 75 mcg Fentanyl were administered intravenously. The patient's vital signs were monitored  continuously by radiology nursing throughout the procedure.  Sedation Time: 30 minutes  TECHNIQUE: Informed consent was obtained from the patient following explanation of the procedure, risks, benefits and alternatives. The patient understands, agrees and consents for the procedure. All questions were addressed. A time out was performed.  The patient was positioned prone and noncontrast localization CT was performed of the pelvis to demonstrate the iliac marrow spaces.  On the right, there is extremely subtle relatively increased attenuation within the marrow space corresponding anatomically with the location of the metastatic lesion seen on prior MRI. Of note, these abnormalities are nearly in visible by CT imaging. Using the MRI scan as a reference, a suitable skin entry site was selected using anatomic markers. The region was then sterilely prepped and draped in standard fashion using chlorhexidine skin prep. Local anesthesia was attained by infiltration of 1% lidocaine.  Under  sterile conditions and local anesthesia, an 11 gauge coaxial bone biopsy needle was advanced into the right iliac marrow space. Needle position was confirmed with CT imaging. Initially, bone marrow aspiration was performed and injected into formalin. Next, the 11 gauge outer cannula was utilized to obtain a right iliac bone core biopsy. The core biopsy was then repeated to insure adequate sampling. The on Control drill system was utilized for both core biopsies. Needle was removed. Hemostasis was obtained with compression. The patient tolerated the procedure well. No immediate complications.  IMPRESSION: 1. CT-guided right iliac bone biopsy and aspiration. Unfortunately, the lesions which are so well seen on MRI are nearly completely occult by CT imaging. The region of the lesions was targeted using anatomic landmarks and the prior MRI for comparison. Signed,  Criselda Peaches, MD  [facility]  [facility]   Electronically Signed   By: Jacqulynn Cadet M.D.   On: 05/25/2014 15:36    ASSESSMENT: Stage IV diffuse large B-cell lymphoma.  PLAN:    1. Lymphoma: Diagnosis and staging confirmed by bone and bone marrow biopsy. We will order a PET scan in the next week for initial staging purposes. Patient will also have a referral to Dr. Baruch Gouty for definitive XRT for his back pain. He will then return to clinic in 2 weeks to receive Rituxan plus CVP. Given patient's advanced age, will not use Adriamycin and dose reduce his chemotherapy onset.   Approximately 30 minutes was spent in discussion and consultation.  Patient expressed understanding and was in agreement with this plan. He also understands that He can call clinic at any time with any questions, concerns, or complaints.    Lloyd Huger, MD   06/16/2014 1:36 PM

## 2014-06-22 ENCOUNTER — Ambulatory Visit: Payer: Medicare Other

## 2014-06-22 ENCOUNTER — Inpatient Hospital Stay: Payer: Medicare Other

## 2014-06-22 ENCOUNTER — Inpatient Hospital Stay (HOSPITAL_BASED_OUTPATIENT_CLINIC_OR_DEPARTMENT_OTHER): Payer: Medicare Other | Admitting: Oncology

## 2014-06-22 VITALS — BP 103/63 | HR 70 | Temp 94.8°F | Resp 18 | Wt 145.7 lb

## 2014-06-22 DIAGNOSIS — R5381 Other malaise: Secondary | ICD-10-CM

## 2014-06-22 DIAGNOSIS — M1990 Unspecified osteoarthritis, unspecified site: Secondary | ICD-10-CM

## 2014-06-22 DIAGNOSIS — Z85820 Personal history of malignant melanoma of skin: Secondary | ICD-10-CM

## 2014-06-22 DIAGNOSIS — R5383 Other fatigue: Secondary | ICD-10-CM | POA: Diagnosis not present

## 2014-06-22 DIAGNOSIS — Z800 Family history of malignant neoplasm of digestive organs: Secondary | ICD-10-CM

## 2014-06-22 DIAGNOSIS — C8330 Diffuse large B-cell lymphoma, unspecified site: Secondary | ICD-10-CM | POA: Diagnosis not present

## 2014-06-22 DIAGNOSIS — N400 Enlarged prostate without lower urinary tract symptoms: Secondary | ICD-10-CM

## 2014-06-22 DIAGNOSIS — Z79899 Other long term (current) drug therapy: Secondary | ICD-10-CM

## 2014-06-22 DIAGNOSIS — E785 Hyperlipidemia, unspecified: Secondary | ICD-10-CM

## 2014-06-22 DIAGNOSIS — Z8679 Personal history of other diseases of the circulatory system: Secondary | ICD-10-CM

## 2014-06-22 DIAGNOSIS — R531 Weakness: Secondary | ICD-10-CM | POA: Diagnosis not present

## 2014-06-22 DIAGNOSIS — E119 Type 2 diabetes mellitus without complications: Secondary | ICD-10-CM

## 2014-06-22 LAB — CBC WITH DIFFERENTIAL/PLATELET
BASOS ABS: 0 10*3/uL (ref 0–0.1)
BASOS PCT: 0 %
Eosinophils Absolute: 0.4 10*3/uL (ref 0–0.7)
Eosinophils Relative: 3 %
HCT: 40.9 % (ref 40.0–52.0)
HEMOGLOBIN: 13.3 g/dL (ref 13.0–18.0)
Lymphocytes Relative: 5 %
Lymphs Abs: 0.6 10*3/uL — ABNORMAL LOW (ref 1.0–3.6)
MCH: 27.4 pg (ref 26.0–34.0)
MCHC: 32.6 g/dL (ref 32.0–36.0)
MCV: 84.2 fL (ref 80.0–100.0)
Monocytes Absolute: 0.8 10*3/uL (ref 0.2–1.0)
Monocytes Relative: 7 %
Neutro Abs: 9.8 10*3/uL — ABNORMAL HIGH (ref 1.4–6.5)
Neutrophils Relative %: 85 %
PLATELETS: 155 10*3/uL (ref 150–440)
RBC: 4.86 MIL/uL (ref 4.40–5.90)
RDW: 15 % — ABNORMAL HIGH (ref 11.5–14.5)
WBC: 11.7 10*3/uL — ABNORMAL HIGH (ref 3.8–10.6)

## 2014-06-22 LAB — COMPREHENSIVE METABOLIC PANEL
ALK PHOS: 315 U/L — AB (ref 38–126)
ALT: 90 U/L — AB (ref 17–63)
AST: 111 U/L — ABNORMAL HIGH (ref 15–41)
Albumin: 2.9 g/dL — ABNORMAL LOW (ref 3.5–5.0)
Anion gap: 5 (ref 5–15)
BUN: 19 mg/dL (ref 6–20)
CO2: 26 mmol/L (ref 22–32)
Calcium: 9.1 mg/dL (ref 8.9–10.3)
Chloride: 103 mmol/L (ref 101–111)
Creatinine, Ser: 0.8 mg/dL (ref 0.61–1.24)
GFR calc Af Amer: >60 mL/min (ref >60)
GFR calc non Af Amer: >60 mL/min (ref >60)
GLUCOSE: 130 mg/dL — AB (ref 65–99)
Potassium: 4 mmol/L (ref 3.5–5.1)
Sodium: 134 mmol/L — ABNORMAL LOW (ref 135–145)
Total Bilirubin: 0.6 mg/dL (ref 0.3–1.2)
Total Protein: 5.6 g/dL — ABNORMAL LOW (ref 6.5–8.1)

## 2014-06-22 MED ORDER — PROCHLORPERAZINE MALEATE 10 MG PO TABS: 10.0000 mg | ORAL_TABLET | Freq: Four times a day (QID) | ORAL | Status: AC | PRN

## 2014-06-22 MED ORDER — DIPHENHYDRAMINE HCL 25 MG PO CAPS
25.0000 mg | ORAL_CAPSULE | Freq: Once | ORAL | Status: AC
  Administered 2014-06-22: 25 mg via ORAL
  Filled 2014-06-22: qty 1

## 2014-06-22 MED ORDER — SODIUM CHLORIDE 0.9 % IJ SOLN
10.0000 mL | INTRAMUSCULAR | Status: DC | PRN
  Administered 2014-06-22: 10 mL
  Filled 2014-06-22: qty 10

## 2014-06-22 MED ORDER — HEPARIN SOD (PORK) LOCK FLUSH 100 UNIT/ML IV SOLN
500.0000 [IU] | Freq: Once | INTRAVENOUS | Status: AC | PRN
  Administered 2014-06-22: 500 [IU]
  Filled 2014-06-22: qty 5

## 2014-06-22 MED ORDER — VINCRISTINE SULFATE CHEMO INJECTION 1 MG/ML
1.7500 mg | Freq: Once | INTRAVENOUS | Status: AC
  Administered 2014-06-22: 1.8 mg via INTRAVENOUS
  Filled 2014-06-22: qty 1.8

## 2014-06-22 MED ORDER — SODIUM CHLORIDE 0.9 % IV SOLN
675.0000 mg/m2 | Freq: Once | INTRAVENOUS | Status: AC
  Administered 2014-06-22: 1200 mg via INTRAVENOUS
  Filled 2014-06-22: qty 50

## 2014-06-22 MED ORDER — ACETAMINOPHEN 325 MG PO TABS
650.0000 mg | ORAL_TABLET | Freq: Once | ORAL | Status: AC
  Administered 2014-06-22: 650 mg via ORAL
  Filled 2014-06-22: qty 2

## 2014-06-22 MED ORDER — SODIUM CHLORIDE 0.9 % IV SOLN
Freq: Once | INTRAVENOUS | Status: AC
  Administered 2014-06-22: 11:00:00 via INTRAVENOUS
  Filled 2014-06-22: qty 8

## 2014-06-22 MED ORDER — SODIUM CHLORIDE 0.9 % IV SOLN
375.0000 mg/m2 | Freq: Once | INTRAVENOUS | Status: AC
  Administered 2014-06-22: 700 mg via INTRAVENOUS
  Filled 2014-06-22: qty 60

## 2014-06-22 MED ORDER — SODIUM CHLORIDE 0.9 % IV SOLN
Freq: Once | INTRAVENOUS | Status: AC
  Administered 2014-06-22: 11:00:00 via INTRAVENOUS
  Filled 2014-06-22: qty 1000

## 2014-06-23 ENCOUNTER — Telehealth: Payer: Self-pay | Admitting: *Deleted

## 2014-06-23 NOTE — Telephone Encounter (Signed)
FYI, his prednisone has already been dosed reduced 50% to 50mg  daily x5 days.  Thanks.

## 2014-06-23 NOTE — Telephone Encounter (Signed)
Advised her that he had steroids with treatment yesterday and that causes the sugar to go up, told her to keep check on it and to discuss with md at next treatment to see if dose can be reduced. She will recheck BS today and call if it gets too high

## 2014-06-27 NOTE — Progress Notes (Signed)
[place]  Telephone:(336) [phone] Fax:(336) [phone]  ID: David Beck OB: [birth year]  MR#: [id number]  TOI#:[id number]  Patient Care Team: Dion Body, MD as PCP - General (Family Medicine) Algernon Huxley, MD as Consulting Physician (Vascular Surgery)  CHIEF COMPLAINT:  Chief Complaint  Patient presents with  . Follow-up    lymphoma    INTERVAL HISTORY: Patient returns to clinic today for further evaluation and initiation of cycle 1 of Rituxan plus CVP. He has significant increased weakness and fatigue today. Patient had diarrhea over the weekend which has since resolved. He continues to have a poor appetite and has lost weight. His pain is improved on his current narcotic regimen. He has no neurologic complaints. He denies any recent fevers. He denies any dysphasia. He denies any chest pain, cough, or shortness of breath. He denies any nausea, vomiting, or constipation. He has no urinary complaints. Patient offers no further specific complaints today.  REVIEW OF SYSTEMS:   Review of Systems  Constitutional: Positive for malaise/fatigue.  Respiratory: Negative.   Cardiovascular: Negative.   Neurological: Positive for weakness.    As per HPI. Otherwise, a complete review of systems is negatve.  PAST MEDICAL HISTORY: Past Medical History  Diagnosis Date  . Diabetes mellitus without complication   . DJD (degenerative joint disease)   . BPH (benign prostatic hyperplasia)   . Hyperlipidemia   . History of hip replacement   . Pneumonia   . Melanoma     PAST SURGICAL HISTORY: Past Surgical History  Procedure Laterality Date  . No past surgeries    . Abdominal aortic aneurysm repair      4 years ago  . Joint replacement    . Peripheral vascular catheterization N/A 06/08/2014    Procedure: Glori Luis Cath Insertion;  Surgeon: Algernon Huxley, MD;  Location: [place] [facility];  Service: Cardiovascular;  Laterality: N/A;    FAMILY HISTORY Family History   Problem Relation Age of Onset  . Stomach cancer Mother   . Melanoma Brother   . Diabetes Brother   . CVA Brother        ADVANCED DIRECTIVES:    HEALTH MAINTENANCE: History  Substance Use Topics  . Smoking status: Former Smoker    Types: Cigarettes    Quit date: 01/14/1978  . Smokeless tobacco: Not on file  . Alcohol Use: No     Colonoscopy:  PAP:  Bone density:  Lipid panel:  Allergies  Allergen Reactions  . No Known Allergies     Current Outpatient Prescriptions  Medication Sig Dispense Refill  . glipiZIDE (GLUCOTROL) 10 MG tablet Take 10 mg by mouth.    . lovastatin (MEVACOR) 20 MG tablet     . mirtazapine (REMERON) 15 MG tablet     . predniSONE (DELTASONE) 50 MG tablet Take 1 tablet daily for 5 days with each chemotherapy treatment 30 tablet 2  . lidocaine-prilocaine (EMLA) cream Apply 1 application topically as needed. Apply to port area 1-2 hours prior to chemotherapy treatment appointment, cover with plastic wrap. 30 g 1  . megestrol (MEGACE) 40 MG/ML suspension Take 10 mLs (400 mg total) by mouth daily. 240 mL 2  . prochlorperazine (COMPAZINE) 10 MG tablet Take 1 tablet (10 mg total) by mouth every 6 (six) hours as needed for nausea or vomiting. 90 tablet 2   No current facility-administered medications for this visit.    OBJECTIVE: Filed Vitals:   06/15/14 1003  BP: 66/48  Pulse: 144  Temp: 98.5 F (36.9 C)  Resp: 18     Body mass index is 20.43 kg/(m^2).    ECOG FS:2 - Symptomatic, <50% confined to bed  General: Well-developed, well-nourished, no acute distress. Eyes: anicteric sclera. Lungs: Clear to auscultation bilaterally. Heart: Regular rate and rhythm. No rubs, murmurs, or gallops. Abdomen: Soft, nontender, nondistended. No organomegaly noted, normoactive bowel sounds. Musculoskeletal: No edema, cyanosis, or clubbing. Neuro: Alert, answering all questions appropriately. Cranial nerves grossly intact. Skin: No rashes or petechiae  noted. Psych: Normal affect.    LAB RESULTS:  Lab Results  Component Value Date   NA 134* 06/22/2014   K 4.0 06/22/2014   CL 103 06/22/2014   CO2 26 06/22/2014   GLUCOSE 130* 06/22/2014   BUN 19 06/22/2014   CREATININE 0.80 06/22/2014   CALCIUM 9.1 06/22/2014   PROT 5.6* 06/22/2014   ALBUMIN 2.9* 06/22/2014   AST 111* 06/22/2014   ALT 90* 06/22/2014   ALKPHOS 315* 06/22/2014   BILITOT 0.6 06/22/2014   GFRNONAA >60 06/22/2014   GFRAA >60 06/22/2014    Lab Results  Component Value Date   WBC 11.7* 06/22/2014   NEUTROABS 9.8* 06/22/2014   HGB 13.3 06/22/2014   HCT 40.9 06/22/2014   MCV 84.2 06/22/2014   PLT 155 06/22/2014     STUDIES: Nm Pet Image Initial (pi) Skull Base To Thigh  06/13/2014   CLINICAL DATA:  Initial Treatment strategy for diffuse large B-cell lymphoma.  EXAM: NUCLEAR MEDICINE PET SKULL BASE TO THIGH  TECHNIQUE: 11.9 mCi F-18 FDG was injected intravenously. Full-ring PET imaging was performed from the skull base to thigh after the radiotracer. CT data was obtained and used for attenuation correction and anatomic localization.  FASTING BLOOD GLUCOSE:  Value: 126 mg/dl  COMPARISON:  Multiple exams, including 04/28/2014  FINDINGS: NECK  A right station 2 lymph node measuring 1 cm in short axis has maximum standard uptake value 7.4. A left supraclavicular lymph node measuring 1.0 cm in short axis on image 56 series 3 has maximum standard uptake value 7.7. Multiple additional small hypermetabolic internal jugular, posterior cervical triangle, and left submandibular lymph nodes are present.  CHEST  Hypermetabolic bilateral axillary, mediastinal, subpectoral, and hilar adenopathy noted. Index subcarinal lymph node has maximum standard uptake value 10.1 and measures 1.7 cm in short axis on image 100 series 3. An index left axillary lymph node measures 1.4 cm in short axis on image 73 series 3 and has maximum standard uptake value 5.5. An index prevascular lymph node  measures 1.7 cm in short axis on image 81 series 3 and has maximum standard uptake value 5.1.  Focal indistinct consolidation in the lingula measuring 1.6 by 1.2 cm has maximum standard uptake value 4.8. Confluent interstitial accentuation with some faint nodularity observed in the right upper lobe and right middle lobe, activity in this region with a maximum standard uptake value of about 2.4.  Underlying severe emphysema. Pleural fluid or pleural thickening at the left lung base without significant hypermetabolic activity.  A 7 mm nodule in the left lower lobe on image 122 series 3 is not appreciably hypermetabolic but is also below sensitive PET-CT size thresholds. Ground-glass opacities are present in both lower lobes with some mild nodularity.  ABDOMEN/PELVIS  Splenomegaly with extensive splenic hypermetabolic activity, maximum standard uptake value approximately 8.7. Posterior splenic hypo metabolism probably from infarct. Small hypermetabolic porta hepatis lymph nodes. A left periaortic node measuring 1.5 cm on image 172 series 3 has maximum standard uptake  value 6.7. Additional hypermetabolic periaortic, right common iliac, and bilateral external iliac adenopathy is present. A left inguinal lymph node measuring 1.0 cm in short axis on image 276 series 3 has maximum standard uptake value 5.1.  Aorta bi-iliac stent.  SKELETON  Widespread osseous metastatic disease including the thoracic spine, lumbar spine, and bony pelvis. There is a compression fracture at L2 with paraspinal hypermetabolic tissue suggesting a potentially malignant compression fracture with paraspinal tumor it is difficult in this case to exclude a small amount of epidural tumor.  IMPRESSION: 1. Splenomegaly with markedly increased splenic activity compatible with splenic involvement by lymphoma. There is also hypermetabolic adenopathy scattered throughout the chest, neck, abdomen, and pelvis. 2. Osseous metastatic disease primarily involving  the axial skeleton. Pathologic compression fracture at L2 with paraspinal tumor- early epidural tumor cannot be readily excluded. 3. Emphysema with some focal fairly high metabolic activity associated with nodularity in the lingula, which could be due to will malignant infiltration of the long or granulomatous process. The confluent abnormal interstitial accentuation in the right upper lobe and right middle lobe is more typical in activity for inflammatory process.   Electronically Signed   By: Van Clines M.D.   On: 06/13/2014 12:38    ASSESSMENT: Stage IV diffuse large B-cell lymphoma.  PLAN:    1. Lymphoma: Diagnosis and staging confirmed by bone and bone marrow biopsy. PET scan results as above reviewed independently. Because of patient's declining performance status and weakness and fatigue, we will delay treatment 1 week. Return to clinic in 1 week for consideration of cycle 1 of Rituxan plus CVP. Plan on giving this treatment every 3 weeks. Patient will also have a referral to Dr. Baruch Gouty for definitive XRT for his back pain. Given patient's advanced age, will not use Adriamycin and dose reduce his chemotherapy onset.  2. Dehydration: Patient will receive IV fluids today. 3. Poor appetite: Patient was given a prescription for Megace. 4. Diarrhea: Resolved.  Patient expressed understanding and was in agreement with this plan. He also understands that He can call clinic at any time with any questions, concerns, or complaints.    Lloyd Huger, MD   06/27/2014 12:28 PM

## 2014-06-27 NOTE — Progress Notes (Signed)
[facility]  [phone] [phone]  ID: David Beck OB: 08/05/1920  MR#: [id number]  GHW#:[id number]  Patient Care Team: Dion Body, MD as PCP - General (Family Medicine) Algernon Huxley, MD as Consulting Physician (Vascular Surgery)  CHIEF COMPLAINT:  Chief Complaint  Patient presents with  . Follow-up    diffuse large B cell lymphoma    INTERVAL HISTORY: Patient returns to clinic today for further evaluation and reconsideration of cycle 1 of Rituxan plus CVP. His weakness and fatigue as well as appetite have significantly improved. His weight is stable. His pain is improved on his current narcotic regimen. He has no neurologic complaints. He denies any recent fevers. He denies any dysphasia. He denies any chest pain, cough, or shortness of breath. He denies any nausea, vomiting, or constipation. He has no urinary complaints. Patient offers no further specific complaints today.  REVIEW OF SYSTEMS:   Review of Systems  Constitutional: Positive for malaise/fatigue.  Respiratory: Negative.   Cardiovascular: Negative.   Neurological: Positive for weakness.    As per HPI. Otherwise, a complete review of systems is negatve.  PAST MEDICAL HISTORY: Past Medical History  Diagnosis Date  . Diabetes mellitus without complication   . DJD (degenerative joint disease)   . BPH (benign prostatic hyperplasia)   . Hyperlipidemia   . History of hip replacement   . Pneumonia   . Melanoma     PAST SURGICAL HISTORY: Past Surgical History  Procedure Laterality Date  . No past surgeries    . Abdominal aortic aneurysm repair      4 years ago  . Joint replacement    . Peripheral vascular catheterization N/A 06/08/2014    Procedure: Glori Luis Cath Insertion;  Surgeon: Algernon Huxley, MD;  Location: [facility];  Service: Cardiovascular;  Laterality: N/A;    FAMILY HISTORY Family History  Problem Relation Age of Onset  . Stomach cancer  Mother   . Melanoma Brother   . Diabetes Brother   . CVA Brother        ADVANCED DIRECTIVES:    HEALTH MAINTENANCE: History  Substance Use Topics  . Smoking status: Former Smoker    Types: Cigarettes    Quit date: 01/14/1978  . Smokeless tobacco: Not on file  . Alcohol Use: No     Colonoscopy:  PAP:  Bone density:  Lipid panel:  Allergies  Allergen Reactions  . No Known Allergies     Current Outpatient Prescriptions  Medication Sig Dispense Refill  . glipiZIDE (GLUCOTROL) 10 MG tablet Take 10 mg by mouth.    . lidocaine-prilocaine (EMLA) cream Apply 1 application topically as needed. Apply to port area 1-2 hours prior to chemotherapy treatment appointment, cover with plastic wrap. 30 g 1  . lovastatin (MEVACOR) 20 MG tablet     . megestrol (MEGACE) 40 MG/ML suspension Take 10 mLs (400 mg total) by mouth daily. 240 mL 2  . mirtazapine (REMERON) 15 MG tablet     . predniSONE (DELTASONE) 50 MG tablet Take 1 tablet daily for 5 days with each chemotherapy treatment 30 tablet 2  . prochlorperazine (COMPAZINE) 10 MG tablet Take 1 tablet (10 mg total) by mouth every 6 (six) hours as needed for nausea or vomiting. 90 tablet 2   No current facility-administered medications for this visit.    OBJECTIVE: Filed Vitals:   06/22/14 1022  BP: 103/63  Pulse: 70  Temp: 94.8 F (34.9 C)  Resp: 18  Body mass index is 20.91 kg/(m^2).    ECOG FS:1 - Symptomatic but completely ambulatory  General: Well-developed, well-nourished, no acute distress. Eyes: anicteric sclera. Lungs: Clear to auscultation bilaterally. Heart: Regular rate and rhythm. No rubs, murmurs, or gallops. Abdomen: Soft, nontender, nondistended. No organomegaly noted, normoactive bowel sounds. Musculoskeletal: No edema, cyanosis, or clubbing. Neuro: Alert, answering all questions appropriately. Cranial nerves grossly intact. Skin: No rashes or petechiae noted. Psych: Normal affect.    LAB RESULTS:  Lab  Results  Component Value Date   NA 134* 06/22/2014   K 4.0 06/22/2014   CL 103 06/22/2014   CO2 26 06/22/2014   GLUCOSE 130* 06/22/2014   BUN 19 06/22/2014   CREATININE 0.80 06/22/2014   CALCIUM 9.1 06/22/2014   PROT 5.6* 06/22/2014   ALBUMIN 2.9* 06/22/2014   AST 111* 06/22/2014   ALT 90* 06/22/2014   ALKPHOS 315* 06/22/2014   BILITOT 0.6 06/22/2014   GFRNONAA >60 06/22/2014   GFRAA >60 06/22/2014    Lab Results  Component Value Date   WBC 11.7* 06/22/2014   NEUTROABS 9.8* 06/22/2014   HGB 13.3 06/22/2014   HCT 40.9 06/22/2014   MCV 84.2 06/22/2014   PLT 155 06/22/2014     STUDIES: Nm Pet Image Initial (pi) Skull Base To Thigh  06/13/2014   CLINICAL DATA:  Initial Treatment strategy for diffuse large B-cell lymphoma.  EXAM: NUCLEAR MEDICINE PET SKULL BASE TO THIGH  TECHNIQUE: 11.9 mCi F-18 FDG was injected intravenously. Full-ring PET imaging was performed from the skull base to thigh after the radiotracer. CT data was obtained and used for attenuation correction and anatomic localization.  FASTING BLOOD GLUCOSE:  Value: 126 mg/dl  COMPARISON:  Multiple exams, including 04/28/2014  FINDINGS: NECK  A right station 2 lymph node measuring 1 cm in short axis has maximum standard uptake value 7.4. A left supraclavicular lymph node measuring 1.0 cm in short axis on image 56 series 3 has maximum standard uptake value 7.7. Multiple additional small hypermetabolic internal jugular, posterior cervical triangle, and left submandibular lymph nodes are present.  CHEST  Hypermetabolic bilateral axillary, mediastinal, subpectoral, and hilar adenopathy noted. Index subcarinal lymph node has maximum standard uptake value 10.1 and measures 1.7 cm in short axis on image 100 series 3. An index left axillary lymph node measures 1.4 cm in short axis on image 73 series 3 and has maximum standard uptake value 5.5. An index prevascular lymph node measures 1.7 cm in short axis on image 81 series 3 and has  maximum standard uptake value 5.1.  Focal indistinct consolidation in the lingula measuring 1.6 by 1.2 cm has maximum standard uptake value 4.8. Confluent interstitial accentuation with some faint nodularity observed in the right upper lobe and right middle lobe, activity in this region with a maximum standard uptake value of about 2.4.  Underlying severe emphysema. Pleural fluid or pleural thickening at the left lung base without significant hypermetabolic activity.  A 7 mm nodule in the left lower lobe on image 122 series 3 is not appreciably hypermetabolic but is also below sensitive PET-CT size thresholds. Ground-glass opacities are present in both lower lobes with some mild nodularity.  ABDOMEN/PELVIS  Splenomegaly with extensive splenic hypermetabolic activity, maximum standard uptake value approximately 8.7. Posterior splenic hypo metabolism probably from infarct. Small hypermetabolic porta hepatis lymph nodes. A left periaortic node measuring 1.5 cm on image 172 series 3 has maximum standard uptake value 6.7. Additional hypermetabolic periaortic, right common iliac, and bilateral external iliac adenopathy  is present. A left inguinal lymph node measuring 1.0 cm in short axis on image 276 series 3 has maximum standard uptake value 5.1.  Aorta bi-iliac stent.  SKELETON  Widespread osseous metastatic disease including the thoracic spine, lumbar spine, and bony pelvis. There is a compression fracture at L2 with paraspinal hypermetabolic tissue suggesting a potentially malignant compression fracture with paraspinal tumor it is difficult in this case to exclude a small amount of epidural tumor.  IMPRESSION: 1. Splenomegaly with markedly increased splenic activity compatible with splenic involvement by lymphoma. There is also hypermetabolic adenopathy scattered throughout the chest, neck, abdomen, and pelvis. 2. Osseous metastatic disease primarily involving the axial skeleton. Pathologic compression fracture at L2  with paraspinal tumor- early epidural tumor cannot be readily excluded. 3. Emphysema with some focal fairly high metabolic activity associated with nodularity in the lingula, which could be due to will malignant infiltration of the long or granulomatous process. The confluent abnormal interstitial accentuation in the right upper lobe and right middle lobe is more typical in activity for inflammatory process.   Electronically Signed   By: Van Clines M.D.   On: 06/13/2014 12:38    ASSESSMENT: Stage IV diffuse large B-cell lymphoma.  PLAN:    1. Lymphoma: Diagnosis and staging confirmed by bone and bone marrow biopsy. PET scan results as above reviewed independently. Proceed with cycle 1 of Rituxan plus CVP today. Plan on giving this treatment every 3 weeks. Patient will also have a referral to Dr. Baruch Gouty for definitive XRT for his back pain. Given patient's advanced age, will not use Adriamycin and dose reduce his chemotherapy onset. Prednisone has also been dose reduced secondary to his advanced age. Return to clinic in 1 week for laboratory work and to assess his toleration of treatment. 2. Poor appetite: Improved, continue Megace.   Patient expressed understanding and was in agreement with this plan. He also understands that He can call clinic at any time with any questions, concerns, or complaints.    Lloyd Huger, MD   06/27/2014 12:41 PM

## 2014-06-29 ENCOUNTER — Ambulatory Visit: Payer: Medicare Other

## 2014-06-29 ENCOUNTER — Inpatient Hospital Stay (HOSPITAL_BASED_OUTPATIENT_CLINIC_OR_DEPARTMENT_OTHER): Payer: Medicare Other | Admitting: Oncology

## 2014-06-29 ENCOUNTER — Inpatient Hospital Stay: Payer: Medicare Other

## 2014-06-29 ENCOUNTER — Ambulatory Visit: Payer: Medicare Other | Admitting: Oncology

## 2014-06-29 VITALS — BP 90/59 | HR 93 | Temp 96.6°F | Resp 16 | Wt 145.3 lb

## 2014-06-29 DIAGNOSIS — M549 Dorsalgia, unspecified: Secondary | ICD-10-CM

## 2014-06-29 DIAGNOSIS — E119 Type 2 diabetes mellitus without complications: Secondary | ICD-10-CM

## 2014-06-29 DIAGNOSIS — Z79899 Other long term (current) drug therapy: Secondary | ICD-10-CM

## 2014-06-29 DIAGNOSIS — C8330 Diffuse large B-cell lymphoma, unspecified site: Secondary | ICD-10-CM

## 2014-06-29 DIAGNOSIS — E785 Hyperlipidemia, unspecified: Secondary | ICD-10-CM

## 2014-06-29 DIAGNOSIS — Z800 Family history of malignant neoplasm of digestive organs: Secondary | ICD-10-CM

## 2014-06-29 DIAGNOSIS — Z85820 Personal history of malignant melanoma of skin: Secondary | ICD-10-CM

## 2014-06-29 DIAGNOSIS — Z87891 Personal history of nicotine dependence: Secondary | ICD-10-CM

## 2014-06-29 DIAGNOSIS — N400 Enlarged prostate without lower urinary tract symptoms: Secondary | ICD-10-CM

## 2014-06-29 DIAGNOSIS — M1990 Unspecified osteoarthritis, unspecified site: Secondary | ICD-10-CM

## 2014-06-29 DIAGNOSIS — Z8679 Personal history of other diseases of the circulatory system: Secondary | ICD-10-CM

## 2014-06-29 LAB — COMPREHENSIVE METABOLIC PANEL
ALBUMIN: 3.1 g/dL — AB (ref 3.5–5.0)
ALT: 107 U/L — ABNORMAL HIGH (ref 17–63)
ANION GAP: 7 (ref 5–15)
AST: 32 U/L (ref 15–41)
Alkaline Phosphatase: 158 U/L — ABNORMAL HIGH (ref 38–126)
BUN: 23 mg/dL — AB (ref 6–20)
CALCIUM: 8.2 mg/dL — AB (ref 8.9–10.3)
CO2: 26 mmol/L (ref 22–32)
Chloride: 101 mmol/L (ref 101–111)
Creatinine, Ser: 1.18 mg/dL (ref 0.61–1.24)
GFR calc Af Amer: >60 mL/min (ref >60)
GFR calc non Af Amer: 52 mL/min — ABNORMAL LOW (ref >60)
GLUCOSE: 258 mg/dL — AB (ref 65–99)
Potassium: 4.4 mmol/L (ref 3.5–5.1)
Sodium: 134 mmol/L — ABNORMAL LOW (ref 135–145)
TOTAL PROTEIN: 5.6 g/dL — AB (ref 6.5–8.1)
Total Bilirubin: 0.8 mg/dL (ref 0.3–1.2)

## 2014-06-29 LAB — CBC WITH DIFFERENTIAL/PLATELET
Basophils Absolute: 0 10*3/uL (ref 0–0.1)
Basophils Relative: 0 %
Eosinophils Absolute: 0.2 10*3/uL (ref 0–0.7)
Eosinophils Relative: 3 %
HCT: 42.4 % (ref 40.0–52.0)
Hemoglobin: 13.6 g/dL (ref 13.0–18.0)
Lymphocytes Relative: 4 %
Lymphs Abs: 0.2 10*3/uL — ABNORMAL LOW (ref 1.0–3.6)
MCH: 27.1 pg (ref 26.0–34.0)
MCHC: 32.2 g/dL (ref 32.0–36.0)
MCV: 84 fL (ref 80.0–100.0)
Monocytes Absolute: 0.1 10*3/uL — ABNORMAL LOW (ref 0.2–1.0)
Monocytes Relative: 1 %
NEUTROS ABS: 5.6 10*3/uL (ref 1.4–6.5)
Neutrophils Relative %: 92 %
Platelets: 103 10*3/uL — ABNORMAL LOW (ref 150–440)
RBC: 5.05 MIL/uL (ref 4.40–5.90)
RDW: 14.6 % — AB (ref 11.5–14.5)
WBC: 6.1 10*3/uL (ref 3.8–10.6)

## 2014-06-29 MED ORDER — SODIUM CHLORIDE 0.9 % IV SOLN
Freq: Once | INTRAVENOUS | Status: DC
  Filled 2014-06-29: qty 1000

## 2014-06-29 MED ORDER — SODIUM CHLORIDE 0.9 % IV SOLN
Freq: Once | INTRAVENOUS | Status: AC
Start: 2014-06-29 — End: 2014-06-29
  Administered 2014-06-29: 11:00:00 via INTRAVENOUS
  Filled 2014-06-29: qty 1000

## 2014-06-29 NOTE — Progress Notes (Signed)
Patient is having some throat irritation that is relieved with lozenges.

## 2014-07-07 ENCOUNTER — Telehealth: Payer: Self-pay | Admitting: *Deleted

## 2014-07-07 NOTE — Telephone Encounter (Signed)
Check UA CS per Dr Mike Gip. I called Dustin Folks with [facility] and asked her to collect sample. She stated she will call the wife back and let her know she is coming to get urine sample

## 2014-07-13 ENCOUNTER — Inpatient Hospital Stay: Payer: Medicare Other

## 2014-07-13 ENCOUNTER — Inpatient Hospital Stay (HOSPITAL_BASED_OUTPATIENT_CLINIC_OR_DEPARTMENT_OTHER): Payer: Medicare Other | Admitting: Oncology

## 2014-07-13 VITALS — BP 104/65 | HR 86 | Temp 97.6°F | Resp 17 | Ht 70.0 in | Wt 149.3 lb

## 2014-07-13 DIAGNOSIS — C8330 Diffuse large B-cell lymphoma, unspecified site: Secondary | ICD-10-CM | POA: Diagnosis not present

## 2014-07-13 DIAGNOSIS — N400 Enlarged prostate without lower urinary tract symptoms: Secondary | ICD-10-CM | POA: Insufficient documentation

## 2014-07-13 DIAGNOSIS — I999 Unspecified disorder of circulatory system: Secondary | ICD-10-CM | POA: Insufficient documentation

## 2014-07-13 DIAGNOSIS — M1990 Unspecified osteoarthritis, unspecified site: Secondary | ICD-10-CM

## 2014-07-13 DIAGNOSIS — R5381 Other malaise: Secondary | ICD-10-CM | POA: Diagnosis not present

## 2014-07-13 DIAGNOSIS — T451X5S Adverse effect of antineoplastic and immunosuppressive drugs, sequela: Secondary | ICD-10-CM

## 2014-07-13 DIAGNOSIS — D6959 Other secondary thrombocytopenia: Secondary | ICD-10-CM | POA: Diagnosis not present

## 2014-07-13 DIAGNOSIS — R531 Weakness: Secondary | ICD-10-CM

## 2014-07-13 DIAGNOSIS — E782 Mixed hyperlipidemia: Secondary | ICD-10-CM | POA: Insufficient documentation

## 2014-07-13 DIAGNOSIS — E119 Type 2 diabetes mellitus without complications: Secondary | ICD-10-CM | POA: Insufficient documentation

## 2014-07-13 DIAGNOSIS — Z79899 Other long term (current) drug therapy: Secondary | ICD-10-CM

## 2014-07-13 DIAGNOSIS — E785 Hyperlipidemia, unspecified: Secondary | ICD-10-CM

## 2014-07-13 DIAGNOSIS — R5383 Other fatigue: Secondary | ICD-10-CM

## 2014-07-13 LAB — COMPREHENSIVE METABOLIC PANEL
ALK PHOS: 90 U/L (ref 38–126)
ALT: 20 U/L (ref 17–63)
AST: 20 U/L (ref 15–41)
Albumin: 3 g/dL — ABNORMAL LOW (ref 3.5–5.0)
Anion gap: 5 (ref 5–15)
BUN: 17 mg/dL (ref 6–20)
CO2: 24 mmol/L (ref 22–32)
Calcium: 7.6 mg/dL — ABNORMAL LOW (ref 8.9–10.3)
Chloride: 106 mmol/L (ref 101–111)
Creatinine, Ser: 0.78 mg/dL (ref 0.61–1.24)
GFR calc non Af Amer: >60 mL/min (ref >60)
Glucose, Bld: 204 mg/dL — ABNORMAL HIGH (ref 65–99)
Potassium: 4.3 mmol/L (ref 3.5–5.1)
SODIUM: 135 mmol/L (ref 135–145)
Total Bilirubin: 0.7 mg/dL (ref 0.3–1.2)
Total Protein: 5.8 g/dL — ABNORMAL LOW (ref 6.5–8.1)

## 2014-07-13 LAB — CBC WITH DIFFERENTIAL/PLATELET
Basophils Absolute: 0.1 10*3/uL (ref 0–0.1)
Basophils Relative: 1 %
EOS PCT: 1 %
Eosinophils Absolute: 0.1 10*3/uL (ref 0–0.7)
HCT: 38.5 % — ABNORMAL LOW (ref 40.0–52.0)
Hemoglobin: 12.5 g/dL — ABNORMAL LOW (ref 13.0–18.0)
LYMPHS PCT: 10 %
Lymphs Abs: 1 10*3/uL (ref 1.0–3.6)
MCH: 26.9 pg (ref 26.0–34.0)
MCHC: 32.5 g/dL (ref 32.0–36.0)
MCV: 82.6 fL (ref 80.0–100.0)
MONOS PCT: 7 %
Monocytes Absolute: 0.7 10*3/uL (ref 0.2–1.0)
Neutro Abs: 8.3 10*3/uL — ABNORMAL HIGH (ref 1.4–6.5)
Neutrophils Relative %: 81 %
PLATELETS: 135 10*3/uL — AB (ref 150–440)
RBC: 4.66 MIL/uL (ref 4.40–5.90)
RDW: 16.1 % — ABNORMAL HIGH (ref 11.5–14.5)
WBC: 10.2 10*3/uL (ref 3.8–10.6)

## 2014-07-13 MED ORDER — HEPARIN SOD (PORK) LOCK FLUSH 100 UNIT/ML IV SOLN
500.0000 [IU] | Freq: Once | INTRAVENOUS | Status: AC | PRN
  Administered 2014-07-13: 500 [IU]
  Filled 2014-07-13: qty 5

## 2014-07-13 MED ORDER — SODIUM CHLORIDE 0.9 % IV SOLN
Freq: Once | INTRAVENOUS | Status: AC
  Administered 2014-07-13: 11:00:00 via INTRAVENOUS
  Filled 2014-07-13: qty 8

## 2014-07-13 MED ORDER — OXYCODONE HCL 10 MG PO TABS: 10.0000 mg | ORAL_TABLET | Freq: Four times a day (QID) | ORAL | Status: DC | PRN

## 2014-07-13 MED ORDER — VINCRISTINE SULFATE CHEMO INJECTION 1 MG/ML
1.7500 mg | Freq: Once | INTRAVENOUS | Status: AC
  Administered 2014-07-13: 1.8 mg via INTRAVENOUS
  Filled 2014-07-13: qty 1.8

## 2014-07-13 MED ORDER — OXYCODONE HCL 5 MG PO TABS: 10.0000 mg | ORAL_TABLET | Freq: Four times a day (QID) | ORAL | Status: DC | PRN

## 2014-07-13 MED ORDER — DIPHENHYDRAMINE HCL 25 MG PO CAPS
25.0000 mg | ORAL_CAPSULE | Freq: Once | ORAL | Status: AC
  Administered 2014-07-13: 25 mg via ORAL
  Filled 2014-07-13: qty 1

## 2014-07-13 MED ORDER — SODIUM CHLORIDE 0.9 % IV SOLN
375.0000 mg/m2 | Freq: Once | INTRAVENOUS | Status: AC
  Administered 2014-07-13: 700 mg via INTRAVENOUS
  Filled 2014-07-13: qty 60

## 2014-07-13 MED ORDER — SODIUM CHLORIDE 0.9 % IV SOLN: 375.0000 mg/m2 | Freq: Once | INTRAVENOUS | Status: DC

## 2014-07-13 MED ORDER — SODIUM CHLORIDE 0.9 % IV SOLN
675.0000 mg/m2 | Freq: Once | INTRAVENOUS | Status: AC
  Administered 2014-07-13: 1200 mg via INTRAVENOUS
  Filled 2014-07-13: qty 10

## 2014-07-13 MED ORDER — HYDROCODONE-ACETAMINOPHEN 5-325 MG PO TABS: 1.0000 | ORAL_TABLET | Freq: Four times a day (QID) | ORAL | Status: DC | PRN

## 2014-07-13 MED ORDER — ACETAMINOPHEN 325 MG PO TABS
650.0000 mg | ORAL_TABLET | Freq: Once | ORAL | Status: AC
  Administered 2014-07-13: 650 mg via ORAL
  Filled 2014-07-13: qty 2

## 2014-07-13 MED ORDER — SODIUM CHLORIDE 0.9 % IV SOLN
Freq: Once | INTRAVENOUS | Status: AC
  Administered 2014-07-13: 11:00:00 via INTRAVENOUS
  Filled 2014-07-13: qty 1000

## 2014-07-14 ENCOUNTER — Other Ambulatory Visit: Payer: Self-pay | Admitting: *Deleted

## 2014-07-14 DIAGNOSIS — C8330 Diffuse large B-cell lymphoma, unspecified site: Secondary | ICD-10-CM

## 2014-07-14 MED ORDER — OXYCODONE HCL 10 MG PO TABS: 10.0000 mg | ORAL_TABLET | Freq: Four times a day (QID) | ORAL | Status: AC | PRN

## 2014-07-15 ENCOUNTER — Ambulatory Visit: Payer: Medicare Other

## 2014-07-15 VITALS — BP 101/62 | HR 139 | Temp 97.3°F | Resp 18

## 2014-07-15 MED ORDER — PEGFILGRASTIM INJECTION 6 MG/0.6ML ~~LOC~~
6.0000 mg | PREFILLED_SYRINGE | Freq: Once | SUBCUTANEOUS | Status: AC
  Administered 2014-07-15: 6 mg via SUBCUTANEOUS

## 2014-07-17 NOTE — Progress Notes (Signed)
[facility]  Telephone:(336) [phone] Fax:(336) [phone]  ID: [name] Setting OB: May 01, 1920  MR#: [id number]  VQM#:[id number]  Patient Care Team: Dion Body, MD as PCP - General (Family Medicine) Algernon Huxley, MD as Consulting Physician (Vascular Surgery)  CHIEF COMPLAINT:  Chief Complaint  Patient presents with  . Follow-up    diffuse large B cell lymphoma    INTERVAL HISTORY: Patient returns to clinic today for further evaluation and to assess his toleration of cycle 1 of Rituxan plus CVP. He had increased weakness and fatigue, but otherwise tolerated it well. He continues to have a fair appetite and his weight is stable. His pain is improved on his current narcotic regimen. He has no neurologic complaints. He denies any recent fevers. He denies any dysphasia. He denies any chest pain, cough, or shortness of breath. He denies any nausea, vomiting, or constipation. He has no urinary complaints. Patient offers no further specific complaints today.  REVIEW OF SYSTEMS:   Review of Systems  Constitutional: Positive for malaise/fatigue.  Respiratory: Negative.   Cardiovascular: Negative.   Neurological: Positive for weakness.    As per HPI. Otherwise, a complete review of systems is negatve.  PAST MEDICAL HISTORY: Past Medical History  Diagnosis Date  . Diabetes mellitus without complication   . DJD (degenerative joint disease)   . BPH (benign prostatic hyperplasia)   . Hyperlipidemia   . History of hip replacement   . Pneumonia   . Melanoma     PAST SURGICAL HISTORY: Past Surgical History  Procedure Laterality Date  . No past surgeries    . Abdominal aortic aneurysm repair      4 years ago  . Joint replacement    . Peripheral vascular catheterization N/A 06/08/2014    Procedure: Glori Luis Cath Insertion;  Surgeon: Algernon Huxley, MD;  Location: [facility];  Service: Cardiovascular;  Laterality: N/A;    FAMILY HISTORY Family History    Problem Relation Age of Onset  . Stomach cancer Mother   . Melanoma Brother   . Diabetes Brother   . CVA Brother        ADVANCED DIRECTIVES:    HEALTH MAINTENANCE: History  Substance Use Topics  . Smoking status: Former Smoker    Types: Cigarettes    Quit date: 01/14/1978  . Smokeless tobacco: Not on file  . Alcohol Use: No     Colonoscopy:  PAP:  Bone density:  Lipid panel:  Allergies  Allergen Reactions  . No Known Allergies     Current Outpatient Prescriptions  Medication Sig Dispense Refill  . glipiZIDE (GLUCOTROL) 10 MG tablet Take 10 mg by mouth.    . lidocaine-prilocaine (EMLA) cream Apply 1 application topically as needed. Apply to port area 1-2 hours prior to chemotherapy treatment appointment, cover with plastic wrap. 30 g 1  . lovastatin (MEVACOR) 20 MG tablet     . megestrol (MEGACE) 40 MG/ML suspension Take 10 mLs (400 mg total) by mouth daily. 240 mL 2  . mirtazapine (REMERON) 15 MG tablet     . predniSONE (DELTASONE) 50 MG tablet Take 1 tablet daily for 5 days with each chemotherapy treatment 30 tablet 2  . prochlorperazine (COMPAZINE) 10 MG tablet Take 1 tablet (10 mg total) by mouth every 6 (six) hours as needed for nausea or vomiting. 90 tablet 2  . HYDROcodone-acetaminophen (NORCO/VICODIN) 5-325 MG per tablet Take 1 tablet by mouth every 6 (six) hours as needed for moderate pain. 90 tablet  0  . Oxycodone HCl 10 MG TABS Take 1 tablet (10 mg total) by mouth every 6 (six) hours as needed. 90 tablet 0   Current Facility-Administered Medications  Medication Dose Route Frequency Provider Last Rate Last Dose  . 0.9 %  sodium chloride infusion   Intravenous Once Lloyd Huger, MD        OBJECTIVE: Filed Vitals:   06/29/14 1136  BP: 90/59  Pulse: 93  Temp: 96.6 F (35.9 C)  Resp: 16     Body mass index is 20.85 kg/(m^2).    ECOG FS:1 - Symptomatic but completely ambulatory  General: Well-developed, well-nourished, no acute distress. Eyes:  anicteric sclera. Lungs: Clear to auscultation bilaterally. Heart: Regular rate and rhythm. No rubs, murmurs, or gallops. Abdomen: Soft, nontender, nondistended. No organomegaly noted, normoactive bowel sounds. Musculoskeletal: No edema, cyanosis, or clubbing. Neuro: Alert, answering all questions appropriately. Cranial nerves grossly intact. Skin: No rashes or petechiae noted. Psych: Normal affect.    LAB RESULTS:  Lab Results  Component Value Date   NA 135 07/13/2014   K 4.3 07/13/2014   CL 106 07/13/2014   CO2 24 07/13/2014   GLUCOSE 204* 07/13/2014   BUN 17 07/13/2014   CREATININE 0.78 07/13/2014   CALCIUM 7.6* 07/13/2014   PROT 5.8* 07/13/2014   ALBUMIN 3.0* 07/13/2014   AST 20 07/13/2014   ALT 20 07/13/2014   ALKPHOS 90 07/13/2014   BILITOT 0.7 07/13/2014   GFRNONAA >60 07/13/2014   GFRAA >60 07/13/2014    Lab Results  Component Value Date   WBC 10.2 07/13/2014   NEUTROABS 8.3* 07/13/2014   HGB 12.5* 07/13/2014   HCT 38.5* 07/13/2014   MCV 82.6 07/13/2014   PLT 135* 07/13/2014     STUDIES: No results found.  ASSESSMENT: Stage IV diffuse large B-cell lymphoma.  PLAN:    1. Lymphoma: Diagnosis and staging confirmed by bone and bone marrow biopsy. PET scan results reviewed independently. Patient received cycle 1 of Rituxan plus CVP last week and tolerated it well. Plan on giving this treatment every 3 weeks. Patient will also have a referral to Dr. Baruch Gouty for definitive XRT for his back pain. Given patient's advanced age, will not use Adriamycin and dose reduce his chemotherapy onset. Prednisone has also been dose reduced secondary to his advanced age. Return to clinic in 2 weeks for laboratory work and consideration of cycle 2. 2. Poor appetite: Improved, continue Megace.   Patient expressed understanding and was in agreement with this plan. He also understands that He can call clinic at any time with any questions, concerns, or complaints.    Lloyd Huger, MD   07/17/2014 11:46 AM

## 2014-07-24 NOTE — Progress Notes (Signed)
[facility]  Telephone:(336) [phone] Fax:(336) [phone]  ID: [name] Setting OB: Feb 15, 1920  MR#: [id number]  EML#:[id number]  Patient Care Team: Dion Body, MD as PCP - General (Family Medicine) Algernon Huxley, MD as Consulting Physician (Vascular Surgery)  CHIEF COMPLAINT:  Chief Complaint  Patient presents with  . Follow-up    Large B cell Lymphoma    INTERVAL HISTORY: Patient returns to clinic today for further evaluation and consideration of cycle 2 of Rituxan plus CVP.  He continues to feel weak and fatigued, but otherwise feels well. He continues to have a fair appetite and his weight is stable. His pain is improved on his current narcotic regimen. He has no neurologic complaints. He denies any recent fevers. He denies any dysphasia. He denies any chest pain, cough, or shortness of breath. He denies any nausea, vomiting, or constipation. He has no urinary complaints. Patient offers no further specific complaints today.  REVIEW OF SYSTEMS:   Review of Systems  Constitutional: Positive for malaise/fatigue.  Respiratory: Negative.   Cardiovascular: Negative.   Neurological: Positive for weakness.    As per HPI. Otherwise, a complete review of systems is negatve.  PAST MEDICAL HISTORY: Past Medical History  Diagnosis Date  . Diabetes mellitus without complication   . DJD (degenerative joint disease)   . BPH (benign prostatic hyperplasia)   . Hyperlipidemia   . History of hip replacement   . Pneumonia   . Melanoma     PAST SURGICAL HISTORY: Past Surgical History  Procedure Laterality Date  . No past surgeries    . Abdominal aortic aneurysm repair      4 years ago  . Joint replacement    . Peripheral vascular catheterization N/A 06/08/2014    Procedure: Glori Luis Cath Insertion;  Surgeon: Algernon Huxley, MD;  Location: [facility];  Service: Cardiovascular;  Laterality: N/A;    FAMILY HISTORY Family History  Problem Relation Age of Onset    . Stomach cancer Mother   . Melanoma Brother   . Diabetes Brother   . CVA Brother        ADVANCED DIRECTIVES:    HEALTH MAINTENANCE: History  Substance Use Topics  . Smoking status: Former Smoker    Types: Cigarettes    Quit date: 01/14/1978  . Smokeless tobacco: Not on file  . Alcohol Use: No     Colonoscopy:  PAP:  Bone density:  Lipid panel:  Allergies  Allergen Reactions  . No Known Allergies     Current Outpatient Prescriptions  Medication Sig Dispense Refill  . glipiZIDE (GLUCOTROL) 10 MG tablet Take 10 mg by mouth.    Marland Kitchen HYDROcodone-acetaminophen (NORCO/VICODIN) 5-325 MG per tablet Take 1 tablet by mouth every 6 (six) hours as needed for moderate pain. 90 tablet 0  . lidocaine-prilocaine (EMLA) cream Apply 1 application topically as needed. Apply to port area 1-2 hours prior to chemotherapy treatment appointment, cover with plastic wrap. 30 g 1  . lovastatin (MEVACOR) 20 MG tablet     . megestrol (MEGACE) 40 MG/ML suspension Take 10 mLs (400 mg total) by mouth daily. 240 mL 2  . mirtazapine (REMERON) 15 MG tablet     . predniSONE (DELTASONE) 50 MG tablet Take 1 tablet daily for 5 days with each chemotherapy treatment 30 tablet 2  . prochlorperazine (COMPAZINE) 10 MG tablet Take 1 tablet (10 mg total) by mouth every 6 (six) hours as needed for nausea or vomiting. 90 tablet 2  .  Oxycodone HCl 10 MG TABS Take 1 tablet (10 mg total) by mouth every 6 (six) hours as needed. 90 tablet 0   No current facility-administered medications for this visit.    OBJECTIVE: Filed Vitals:   07/13/14 0953  BP: 104/65  Pulse: 86  Temp: 97.6 F (36.4 C)  Resp: 17     Body mass index is 21.42 kg/(m^2).    ECOG FS:1 - Symptomatic but completely ambulatory  General: Well-developed, well-nourished, no acute distress. Eyes: anicteric sclera. Lungs: Clear to auscultation bilaterally. Heart: Regular rate and rhythm. No rubs, murmurs, or gallops. Abdomen: Soft, nontender,  nondistended. No organomegaly noted, normoactive bowel sounds. Musculoskeletal: No edema, cyanosis, or clubbing. Neuro: Alert, answering all questions appropriately. Cranial nerves grossly intact. Skin: No rashes or petechiae noted. Psych: Normal affect.    LAB RESULTS:  Lab Results  Component Value Date   NA 135 07/13/2014   K 4.3 07/13/2014   CL 106 07/13/2014   CO2 24 07/13/2014   GLUCOSE 204* 07/13/2014   BUN 17 07/13/2014   CREATININE 0.78 07/13/2014   CALCIUM 7.6* 07/13/2014   PROT 5.8* 07/13/2014   ALBUMIN 3.0* 07/13/2014   AST 20 07/13/2014   ALT 20 07/13/2014   ALKPHOS 90 07/13/2014   BILITOT 0.7 07/13/2014   GFRNONAA >60 07/13/2014   GFRAA >60 07/13/2014    Lab Results  Component Value Date   WBC 10.2 07/13/2014   NEUTROABS 8.3* 07/13/2014   HGB 12.5* 07/13/2014   HCT 38.5* 07/13/2014   MCV 82.6 07/13/2014   PLT 135* 07/13/2014     STUDIES: No results found.  ASSESSMENT: Stage IV diffuse large B-cell lymphoma.  PLAN:    1. Lymphoma: Diagnosis and staging confirmed by bone and bone marrow biopsy. PET scan results reviewed independently. Proceed with cycle 2 of Rituxan plus CVP today. Plan on giving this treatment every 3 weeks. Continue XRT for his back pain. Given patient's advanced age, will not use Adriamycin and dose reduce his chemotherapy onset. Prednisone has also been dose reduced secondary to his advanced age. Return to clinic in 2 days for Neulasta and then in 3 weeks for consideration of cycle 3. Plan to reimage after cycle 6. 2. Poor appetite: Improved, continue Megace.  3. Thrombocytopenia: Mild, secondary to chemotherapy. Monitor.  Patient expressed understanding and was in agreement with this plan. He also understands that He can call clinic at any time with any questions, concerns, or complaints.    Lloyd Huger, MD   07/24/2014 8:25 AM

## 2014-07-26 ENCOUNTER — Telehealth: Payer: Self-pay | Admitting: *Deleted

## 2014-07-26 MED ORDER — MAGIC MOUTHWASH: 5.0000 mL | Freq: Four times a day (QID) | ORAL | Status: AC | PRN

## 2014-07-26 NOTE — Telephone Encounter (Signed)
Magic mouthwash e scribed

## 2014-08-03 ENCOUNTER — Ambulatory Visit: Payer: Medicare Other

## 2014-08-03 ENCOUNTER — Ambulatory Visit: Payer: Medicare Other | Admitting: Oncology

## 2014-08-03 ENCOUNTER — Inpatient Hospital Stay: Payer: Medicare Other | Attending: Oncology

## 2014-08-03 ENCOUNTER — Inpatient Hospital Stay (HOSPITAL_BASED_OUTPATIENT_CLINIC_OR_DEPARTMENT_OTHER): Payer: Medicare Other | Admitting: Oncology

## 2014-08-03 ENCOUNTER — Other Ambulatory Visit: Payer: Medicare Other

## 2014-08-03 ENCOUNTER — Inpatient Hospital Stay: Payer: Medicare Other

## 2014-08-03 VITALS — BP 121/71 | HR 72 | Resp 20

## 2014-08-03 VITALS — BP 72/45 | HR 97 | Temp 96.9°F | Resp 18 | Wt 148.4 lb

## 2014-08-03 DIAGNOSIS — T451X5S Adverse effect of antineoplastic and immunosuppressive drugs, sequela: Secondary | ICD-10-CM

## 2014-08-03 DIAGNOSIS — M1990 Unspecified osteoarthritis, unspecified site: Secondary | ICD-10-CM

## 2014-08-03 DIAGNOSIS — Z79899 Other long term (current) drug therapy: Secondary | ICD-10-CM | POA: Diagnosis not present

## 2014-08-03 DIAGNOSIS — R531 Weakness: Secondary | ICD-10-CM

## 2014-08-03 DIAGNOSIS — Z5111 Encounter for antineoplastic chemotherapy: Secondary | ICD-10-CM | POA: Insufficient documentation

## 2014-08-03 DIAGNOSIS — R42 Dizziness and giddiness: Secondary | ICD-10-CM | POA: Diagnosis not present

## 2014-08-03 DIAGNOSIS — D6959 Other secondary thrombocytopenia: Secondary | ICD-10-CM

## 2014-08-03 DIAGNOSIS — Z87891 Personal history of nicotine dependence: Secondary | ICD-10-CM | POA: Diagnosis not present

## 2014-08-03 DIAGNOSIS — R5383 Other fatigue: Secondary | ICD-10-CM | POA: Diagnosis not present

## 2014-08-03 DIAGNOSIS — C8330 Diffuse large B-cell lymphoma, unspecified site: Secondary | ICD-10-CM

## 2014-08-03 DIAGNOSIS — E785 Hyperlipidemia, unspecified: Secondary | ICD-10-CM | POA: Diagnosis not present

## 2014-08-03 DIAGNOSIS — N400 Enlarged prostate without lower urinary tract symptoms: Secondary | ICD-10-CM | POA: Diagnosis not present

## 2014-08-03 DIAGNOSIS — Z85820 Personal history of malignant melanoma of skin: Secondary | ICD-10-CM | POA: Diagnosis not present

## 2014-08-03 DIAGNOSIS — E119 Type 2 diabetes mellitus without complications: Secondary | ICD-10-CM

## 2014-08-03 DIAGNOSIS — R630 Anorexia: Secondary | ICD-10-CM | POA: Insufficient documentation

## 2014-08-03 DIAGNOSIS — M549 Dorsalgia, unspecified: Secondary | ICD-10-CM | POA: Diagnosis not present

## 2014-08-03 LAB — COMPREHENSIVE METABOLIC PANEL
ALBUMIN: 3.1 g/dL — AB (ref 3.5–5.0)
ALT: 15 U/L — ABNORMAL LOW (ref 17–63)
AST: 19 U/L (ref 15–41)
Alkaline Phosphatase: 88 U/L (ref 38–126)
Anion gap: 6 (ref 5–15)
BILIRUBIN TOTAL: 0.8 mg/dL (ref 0.3–1.2)
BUN: 14 mg/dL (ref 6–20)
CALCIUM: 7.5 mg/dL — AB (ref 8.9–10.3)
CHLORIDE: 103 mmol/L (ref 101–111)
CO2: 26 mmol/L (ref 22–32)
Creatinine, Ser: 0.87 mg/dL (ref 0.61–1.24)
GFR calc non Af Amer: >60 mL/min (ref >60)
GLUCOSE: 136 mg/dL — AB (ref 65–99)
POTASSIUM: 4.1 mmol/L (ref 3.5–5.1)
SODIUM: 135 mmol/L (ref 135–145)
Total Protein: 5.5 g/dL — ABNORMAL LOW (ref 6.5–8.1)

## 2014-08-03 LAB — CBC WITH DIFFERENTIAL/PLATELET
BASOS ABS: 0.1 10*3/uL (ref 0–0.1)
Basophils Relative: 1 %
Eosinophils Absolute: 0.6 10*3/uL (ref 0–0.7)
Eosinophils Relative: 3 %
HEMATOCRIT: 39 % — AB (ref 40.0–52.0)
HEMOGLOBIN: 12.5 g/dL — AB (ref 13.0–18.0)
Lymphocytes Relative: 14 %
Lymphs Abs: 2.5 10*3/uL (ref 1.0–3.6)
MCH: 26.8 pg (ref 26.0–34.0)
MCHC: 32.1 g/dL (ref 32.0–36.0)
MCV: 83.5 fL (ref 80.0–100.0)
Monocytes Absolute: 1.2 10*3/uL — ABNORMAL HIGH (ref 0.2–1.0)
Monocytes Relative: 7 %
Neutro Abs: 12.9 10*3/uL — ABNORMAL HIGH (ref 1.4–6.5)
Neutrophils Relative %: 75 %
Platelets: 289 10*3/uL (ref 150–440)
RBC: 4.67 MIL/uL (ref 4.40–5.90)
RDW: 20.5 % — ABNORMAL HIGH (ref 11.5–14.5)
WBC: 17.2 10*3/uL — AB (ref 3.8–10.6)

## 2014-08-03 MED ORDER — SODIUM CHLORIDE 0.9 % IV SOLN
Freq: Once | INTRAVENOUS | Status: AC
Start: 2014-08-03 — End: 2014-08-03
  Administered 2014-08-03: 11:00:00 via INTRAVENOUS
  Filled 2014-08-03: qty 1000

## 2014-08-03 MED ORDER — ACETAMINOPHEN 325 MG PO TABS
650.0000 mg | ORAL_TABLET | Freq: Once | ORAL | Status: AC
Start: 2014-08-03 — End: 2014-08-03
  Administered 2014-08-03: 650 mg via ORAL
  Filled 2014-08-03: qty 2

## 2014-08-03 MED ORDER — HEPARIN SOD (PORK) LOCK FLUSH 100 UNIT/ML IV SOLN
500.0000 [IU] | Freq: Once | INTRAVENOUS | Status: AC
  Administered 2014-08-03: 500 [IU] via INTRAVENOUS

## 2014-08-03 MED ORDER — SODIUM CHLORIDE 0.9 % IV SOLN: 375.0000 mg/m2 | Freq: Once | INTRAVENOUS | Status: DC

## 2014-08-03 MED ORDER — SODIUM CHLORIDE 0.9 % IV SOLN
Freq: Once | INTRAVENOUS | Status: AC
  Administered 2014-08-03: 13:00:00 via INTRAVENOUS
  Filled 2014-08-03: qty 1000

## 2014-08-03 MED ORDER — SODIUM CHLORIDE 0.9 % IV SOLN
375.0000 mg/m2 | Freq: Once | INTRAVENOUS | Status: AC
  Administered 2014-08-03: 700 mg via INTRAVENOUS
  Filled 2014-08-03: qty 70

## 2014-08-03 MED ORDER — SODIUM CHLORIDE 0.9 % IV SOLN
Freq: Once | INTRAVENOUS | Status: AC
  Administered 2014-08-03: 13:00:00 via INTRAVENOUS
  Filled 2014-08-03: qty 8

## 2014-08-03 MED ORDER — DIPHENHYDRAMINE HCL 25 MG PO CAPS
25.0000 mg | ORAL_CAPSULE | Freq: Once | ORAL | Status: AC
  Administered 2014-08-03: 25 mg via ORAL
  Filled 2014-08-03: qty 1

## 2014-08-03 MED ORDER — SODIUM CHLORIDE 0.9 % IV SOLN
1.7500 mg | Freq: Once | INTRAVENOUS | Status: AC
  Administered 2014-08-03: 1.8 mg via INTRAVENOUS
  Filled 2014-08-03: qty 1.8

## 2014-08-03 MED ORDER — SODIUM CHLORIDE 0.9 % IV SOLN
675.0000 mg/m2 | Freq: Once | INTRAVENOUS | Status: AC
  Administered 2014-08-03: 1200 mg via INTRAVENOUS
  Filled 2014-08-03: qty 25

## 2014-08-03 NOTE — Progress Notes (Signed)
Patient's blood pressure is low today but he does not offer any symptoms other than a slight dizziness when I asked how he felt when standing from wheelchair to get on the scales.

## 2014-08-04 ENCOUNTER — Telehealth: Payer: Self-pay | Admitting: *Deleted

## 2014-08-04 NOTE — Telephone Encounter (Signed)
Called David Beck to inform her it is okay per Dr. Grayland Ormond to give patient Pura Spice.  Also recommends Eucerin or Aquaphor lotions.

## 2014-08-04 NOTE — Telephone Encounter (Signed)
Patient may take Benadryl for itching,try Eucerin or Aquaphor lotion to keep skin moisturized.

## 2014-08-04 NOTE — Telephone Encounter (Signed)
Patient has dry, itchy skin.  Can patient have order for Benadryl?

## 2014-08-05 ENCOUNTER — Inpatient Hospital Stay: Payer: Medicare Other

## 2014-08-05 VITALS — BP 74/50 | HR 58 | Temp 96.4°F | Resp 20

## 2014-08-05 DIAGNOSIS — C8330 Diffuse large B-cell lymphoma, unspecified site: Secondary | ICD-10-CM | POA: Diagnosis not present

## 2014-08-05 MED ORDER — PEGFILGRASTIM INJECTION 6 MG/0.6ML ~~LOC~~
PREFILLED_SYRINGE | SUBCUTANEOUS | Status: AC
  Filled 2014-08-05: qty 0.6

## 2014-08-05 MED ORDER — PEGFILGRASTIM INJECTION 6 MG/0.6ML ~~LOC~~
6.0000 mg | PREFILLED_SYRINGE | Freq: Once | SUBCUTANEOUS | Status: AC
  Administered 2014-08-05: 6 mg via SUBCUTANEOUS

## 2014-08-07 ENCOUNTER — Telehealth: Payer: Self-pay | Admitting: *Deleted

## 2014-08-07 NOTE — Telephone Encounter (Signed)
Pt can come in for more fluids and BP check this week if he is still symptomatic.

## 2014-08-07 NOTE — Telephone Encounter (Signed)
Calling to see if Dr. Grayland Ormond received her fax today. Performed her prn visit to pt today; and his BP was 82/44. Advised pt to increase his oral fluid intake and rest with his legs elevated. He had a fall on Saturday, and has a few skin tears. Is aware that he received fluids at the [facility] on Saturday..Is he in need of anything else?

## 2014-08-08 NOTE — Telephone Encounter (Signed)
Schedule message sent for IVF for fist available this week and informed David Beck.

## 2014-08-08 NOTE — Telephone Encounter (Signed)
Scheduled for IVF on 08/09/14 @ 2:00

## 2014-08-09 ENCOUNTER — Telehealth: Payer: Self-pay | Admitting: *Deleted

## 2014-08-09 ENCOUNTER — Inpatient Hospital Stay
Admission: EM | Admit: 2014-08-09 | Discharge: 2014-08-13 | DRG: 193 | Disposition: A | Discharge status: Home / Self Care | Payer: Medicare Other | Attending: Internal Medicine | Admitting: Internal Medicine

## 2014-08-09 ENCOUNTER — Emergency Department: Payer: Medicare Other

## 2014-08-09 ENCOUNTER — Ambulatory Visit: Payer: Medicare Other

## 2014-08-09 ENCOUNTER — Observation Stay
Admit: 2014-08-09 | Discharge: 2014-08-09 | Disposition: A | Discharge status: Home / Self Care | Payer: Medicare Other | Attending: Internal Medicine | Admitting: Internal Medicine

## 2014-08-09 ENCOUNTER — Encounter: Payer: Self-pay | Admitting: Emergency Medicine

## 2014-08-09 DIAGNOSIS — C8330 Diffuse large B-cell lymphoma, unspecified site: Secondary | ICD-10-CM | POA: Diagnosis present

## 2014-08-09 DIAGNOSIS — Z8679 Personal history of other diseases of the circulatory system: Secondary | ICD-10-CM

## 2014-08-09 DIAGNOSIS — E119 Type 2 diabetes mellitus without complications: Secondary | ICD-10-CM | POA: Diagnosis present

## 2014-08-09 DIAGNOSIS — R0602 Shortness of breath: Secondary | ICD-10-CM

## 2014-08-09 DIAGNOSIS — D638 Anemia in other chronic diseases classified elsewhere: Secondary | ICD-10-CM | POA: Diagnosis present

## 2014-08-09 DIAGNOSIS — J90 Pleural effusion, not elsewhere classified: Secondary | ICD-10-CM | POA: Diagnosis present

## 2014-08-09 DIAGNOSIS — I4891 Unspecified atrial fibrillation: Secondary | ICD-10-CM | POA: Diagnosis present

## 2014-08-09 DIAGNOSIS — Z79899 Other long term (current) drug therapy: Secondary | ICD-10-CM

## 2014-08-09 DIAGNOSIS — J189 Pneumonia, unspecified organism: Secondary | ICD-10-CM | POA: Diagnosis not present

## 2014-08-09 DIAGNOSIS — Z79818 Long term (current) use of other agents affecting estrogen receptors and estrogen levels: Secondary | ICD-10-CM

## 2014-08-09 DIAGNOSIS — Z6821 Body mass index (BMI) 21.0-21.9, adult: Secondary | ICD-10-CM

## 2014-08-09 DIAGNOSIS — E785 Hyperlipidemia, unspecified: Secondary | ICD-10-CM | POA: Diagnosis present

## 2014-08-09 DIAGNOSIS — Z800 Family history of malignant neoplasm of digestive organs: Secondary | ICD-10-CM

## 2014-08-09 DIAGNOSIS — Z96649 Presence of unspecified artificial hip joint: Secondary | ICD-10-CM | POA: Diagnosis present

## 2014-08-09 DIAGNOSIS — E860 Dehydration: Secondary | ICD-10-CM | POA: Diagnosis present

## 2014-08-09 DIAGNOSIS — I951 Orthostatic hypotension: Secondary | ICD-10-CM | POA: Diagnosis present

## 2014-08-09 DIAGNOSIS — N400 Enlarged prostate without lower urinary tract symptoms: Secondary | ICD-10-CM | POA: Diagnosis present

## 2014-08-09 DIAGNOSIS — Z85820 Personal history of malignant melanoma of skin: Secondary | ICD-10-CM

## 2014-08-09 DIAGNOSIS — E43 Unspecified severe protein-calorie malnutrition: Secondary | ICD-10-CM | POA: Insufficient documentation

## 2014-08-09 DIAGNOSIS — Z823 Family history of stroke: Secondary | ICD-10-CM

## 2014-08-09 DIAGNOSIS — M1990 Unspecified osteoarthritis, unspecified site: Secondary | ICD-10-CM | POA: Diagnosis present

## 2014-08-09 DIAGNOSIS — I4892 Unspecified atrial flutter: Secondary | ICD-10-CM | POA: Diagnosis present

## 2014-08-09 DIAGNOSIS — I248 Other forms of acute ischemic heart disease: Secondary | ICD-10-CM | POA: Diagnosis present

## 2014-08-09 DIAGNOSIS — I38 Endocarditis, valve unspecified: Secondary | ICD-10-CM | POA: Diagnosis present

## 2014-08-09 DIAGNOSIS — Z79891 Long term (current) use of opiate analgesic: Secondary | ICD-10-CM

## 2014-08-09 DIAGNOSIS — Z808 Family history of malignant neoplasm of other organs or systems: Secondary | ICD-10-CM

## 2014-08-09 DIAGNOSIS — Z833 Family history of diabetes mellitus: Secondary | ICD-10-CM

## 2014-08-09 DIAGNOSIS — Z87891 Personal history of nicotine dependence: Secondary | ICD-10-CM

## 2014-08-09 DIAGNOSIS — Z9181 History of falling: Secondary | ICD-10-CM

## 2014-08-09 HISTORY — DX: Small cell B-cell lymphoma, unspecified site: C83.00

## 2014-08-09 LAB — URINALYSIS COMPLETE WITH MICROSCOPIC (ARMC ONLY)
BILIRUBIN URINE: NEGATIVE
Bacteria, UA: NONE SEEN
GLUCOSE, UA: NEGATIVE mg/dL
Hgb urine dipstick: NEGATIVE
Ketones, ur: NEGATIVE mg/dL
Leukocytes, UA: NEGATIVE
Nitrite: NEGATIVE
PH: 8 (ref 5.0–8.0)
Protein, ur: NEGATIVE mg/dL
Specific Gravity, Urine: 1.015 (ref 1.005–1.030)
Squamous Epithelial / LPF: NONE SEEN

## 2014-08-09 LAB — COMPREHENSIVE METABOLIC PANEL
ALBUMIN: 2.8 g/dL — AB (ref 3.5–5.0)
ALT: 25 U/L (ref 17–63)
AST: 17 U/L (ref 15–41)
Alkaline Phosphatase: 101 U/L (ref 38–126)
Anion gap: 8 (ref 5–15)
BILIRUBIN TOTAL: 0.7 mg/dL (ref 0.3–1.2)
BUN: 16 mg/dL (ref 6–20)
CALCIUM: 7.7 mg/dL — AB (ref 8.9–10.3)
CO2: 26 mmol/L (ref 22–32)
Chloride: 103 mmol/L (ref 101–111)
Creatinine, Ser: 0.52 mg/dL — ABNORMAL LOW (ref 0.61–1.24)
GFR calc Af Amer: >60 mL/min (ref >60)
Glucose, Bld: 54 mg/dL — ABNORMAL LOW (ref 65–99)
Potassium: 3.3 mmol/L — ABNORMAL LOW (ref 3.5–5.1)
SODIUM: 137 mmol/L (ref 135–145)
Total Protein: 4.9 g/dL — ABNORMAL LOW (ref 6.5–8.1)

## 2014-08-09 LAB — CBC WITH DIFFERENTIAL/PLATELET
Basophils Absolute: 0 10*3/uL (ref 0–0.1)
Basophils Relative: 0 %
Eosinophils Absolute: 0.2 10*3/uL (ref 0–0.7)
Eosinophils Relative: 2 %
HEMATOCRIT: 31.7 % — AB (ref 40.0–52.0)
Hemoglobin: 10.5 g/dL — ABNORMAL LOW (ref 13.0–18.0)
LYMPHS ABS: 0.8 10*3/uL — AB (ref 1.0–3.6)
MCH: 27.5 pg (ref 26.0–34.0)
MCHC: 33 g/dL (ref 32.0–36.0)
MCV: 83.3 fL (ref 80.0–100.0)
MONO ABS: 0.3 10*3/uL (ref 0.2–1.0)
Neutro Abs: 11 10*3/uL — ABNORMAL HIGH (ref 1.4–6.5)
Neutrophils Relative %: 88 %
Platelets: 95 10*3/uL — ABNORMAL LOW (ref 150–440)
RBC: 3.81 MIL/uL — ABNORMAL LOW (ref 4.40–5.90)
RDW: 20.5 % — AB (ref 11.5–14.5)
WBC: 12.4 10*3/uL — ABNORMAL HIGH (ref 3.8–10.6)

## 2014-08-09 LAB — TSH: TSH: 4.039 u[IU]/mL (ref 0.350–4.500)

## 2014-08-09 LAB — GLUCOSE, CAPILLARY: Glucose-Capillary: 86 mg/dL (ref 65–99)

## 2014-08-09 LAB — LACTIC ACID, PLASMA
LACTIC ACID, VENOUS: 1.4 mmol/L (ref 0.5–2.0)
LACTIC ACID, VENOUS: 3 mmol/L — AB (ref 0.5–2.0)
Lactic Acid, Venous: 3.3 mmol/L (ref 0.5–2.0)

## 2014-08-09 LAB — MRSA PCR SCREENING: MRSA by PCR: NEGATIVE

## 2014-08-09 LAB — TROPONIN I: TROPONIN I: 0.45 ng/mL — AB (ref <0.031)

## 2014-08-09 MED ORDER — DOCUSATE SODIUM 100 MG PO CAPS
100.0000 mg | ORAL_CAPSULE | Freq: Two times a day (BID) | ORAL | Status: DC
  Administered 2014-08-10 – 2014-08-13 (×6): 100 mg via ORAL
  Filled 2014-08-09 (×7): qty 1

## 2014-08-09 MED ORDER — ENOXAPARIN SODIUM 40 MG/0.4ML ~~LOC~~ SOLN
40.0000 mg | SUBCUTANEOUS | Status: DC
  Administered 2014-08-10 – 2014-08-12 (×3): 40 mg via SUBCUTANEOUS
  Filled 2014-08-09 (×3): qty 0.4

## 2014-08-09 MED ORDER — ONDANSETRON HCL 4 MG/2ML IJ SOLN: 4.0000 mg | Freq: Four times a day (QID) | INTRAMUSCULAR | Status: DC | PRN

## 2014-08-09 MED ORDER — ONDANSETRON HCL 4 MG PO TABS: 4.0000 mg | ORAL_TABLET | Freq: Four times a day (QID) | ORAL | Status: DC | PRN

## 2014-08-09 MED ORDER — DILTIAZEM HCL 25 MG/5ML IV SOLN
INTRAVENOUS | Status: AC
Start: 2014-08-09 — End: 2014-08-10
  Filled 2014-08-09: qty 5

## 2014-08-09 MED ORDER — ACETAMINOPHEN 325 MG PO TABS: 650.0000 mg | ORAL_TABLET | Freq: Four times a day (QID) | ORAL | Status: DC | PRN

## 2014-08-09 MED ORDER — GLIPIZIDE 5 MG PO TABS
5.0000 mg | ORAL_TABLET | Freq: Two times a day (BID) | ORAL | Status: DC
  Administered 2014-08-09: 5 mg via ORAL
  Filled 2014-08-09: qty 1

## 2014-08-09 MED ORDER — SODIUM CHLORIDE 0.9 % IV BOLUS (SEPSIS)
500.0000 mL | Freq: Once | INTRAVENOUS | Status: AC
  Administered 2014-08-09: 500 mL via INTRAVENOUS

## 2014-08-09 MED ORDER — HYDROCODONE-ACETAMINOPHEN 5-325 MG PO TABS: 1.0000 | ORAL_TABLET | Freq: Four times a day (QID) | ORAL | Status: DC | PRN

## 2014-08-09 MED ORDER — MEGESTROL ACETATE 40 MG/ML PO SUSP
400.0000 mg | Freq: Every day | ORAL | Status: DC
  Administered 2014-08-09 – 2014-08-13 (×4): 400 mg via ORAL
  Filled 2014-08-09 (×8): qty 10

## 2014-08-09 MED ORDER — SODIUM CHLORIDE 0.9 % IV SOLN
INTRAVENOUS | Status: DC
  Administered 2014-08-10: 06:00:00 via INTRAVENOUS
  Administered 2014-08-11: 75 mL/h via INTRAVENOUS
  Administered 2014-08-12: 10:00:00 via INTRAVENOUS

## 2014-08-09 MED ORDER — IOHEXOL 350 MG/ML SOLN
100.0000 mL | Freq: Once | INTRAVENOUS | Status: AC | PRN
  Administered 2014-08-09: 100 mL via INTRAVENOUS

## 2014-08-09 MED ORDER — ALBUTEROL SULFATE (2.5 MG/3ML) 0.083% IN NEBU: 2.5000 mg | INHALATION_SOLUTION | RESPIRATORY_TRACT | Status: DC | PRN

## 2014-08-09 MED ORDER — CETYLPYRIDINIUM CHLORIDE 0.05 % MT LIQD
7.0000 mL | Freq: Two times a day (BID) | OROMUCOSAL | Status: DC
  Administered 2014-08-09 – 2014-08-13 (×7): 7 mL via OROMUCOSAL

## 2014-08-09 MED ORDER — MAGIC MOUTHWASH
5.0000 mL | Freq: Four times a day (QID) | ORAL | Status: DC | PRN
  Administered 2014-08-12: 10 mL via ORAL
  Filled 2014-08-09 (×2): qty 5

## 2014-08-09 MED ORDER — LORAZEPAM 0.5 MG PO TABS
0.5000 mg | ORAL_TABLET | Freq: Every evening | ORAL | Status: DC | PRN
  Administered 2014-08-09 – 2014-08-11 (×3): 0.5 mg via ORAL
  Filled 2014-08-09 (×3): qty 1

## 2014-08-09 MED ORDER — DILTIAZEM HCL 25 MG/5ML IV SOLN
10.0000 mg | Freq: Once | INTRAVENOUS | Status: AC
  Administered 2014-08-09: 10 mg via INTRAVENOUS

## 2014-08-09 MED ORDER — ACETAMINOPHEN 650 MG RE SUPP: 650.0000 mg | Freq: Four times a day (QID) | RECTAL | Status: DC | PRN

## 2014-08-09 MED ORDER — OXYCODONE HCL 5 MG PO TABS: 10.0000 mg | ORAL_TABLET | Freq: Four times a day (QID) | ORAL | Status: DC | PRN

## 2014-08-09 MED ORDER — INSULIN ASPART 100 UNIT/ML ~~LOC~~ SOLN
0.0000 [IU] | Freq: Three times a day (TID) | SUBCUTANEOUS | Status: DC
Start: 2014-08-10 — End: 2014-08-13
  Administered 2014-08-10 (×2): 1 [IU] via SUBCUTANEOUS
  Administered 2014-08-11 (×2): 3 [IU] via SUBCUTANEOUS
  Administered 2014-08-12: 2 [IU] via SUBCUTANEOUS
  Administered 2014-08-12: 3 [IU] via SUBCUTANEOUS
  Administered 2014-08-13: 1 [IU] via SUBCUTANEOUS
  Filled 2014-08-09: qty 3
  Filled 2014-08-09 (×2): qty 1
  Filled 2014-08-09: qty 2
  Filled 2014-08-09: qty 3
  Filled 2014-08-09 (×2): qty 1
  Filled 2014-08-09: qty 3

## 2014-08-09 MED ORDER — DILTIAZEM HCL 30 MG PO TABS
30.0000 mg | ORAL_TABLET | Freq: Four times a day (QID) | ORAL | Status: DC
  Administered 2014-08-09: 30 mg via ORAL
  Filled 2014-08-09 (×2): qty 1

## 2014-08-09 NOTE — ED Notes (Signed)
Attempted to perform Orthostatic vitals, upon sitting pt up, BP went to 86/63. Did not attempt to stand pt up d/t hypotension.

## 2014-08-09 NOTE — ED Notes (Signed)
Pt returned from CT °

## 2014-08-09 NOTE — Telephone Encounter (Signed)
  Oncology Nurse Navigator Documentation    Navigator Encounter Type: Telephone (08/09/14 1000)               Received call from spouse reporting that this am patient has been too weak to get out of bathroom by himself. She had a neighbor assist. [facility] nurse has evaluated and asked her to call the cancer center. Reports no other s/s but that he is too weak for being brought in to cancer center by car.   Instructed spouse to call ems for transport to ED for evaluation.

## 2014-08-09 NOTE — ED Provider Notes (Signed)
After orthostatics patient became markedly tachycardic. EKG rechecked and fluids started. Heart rate slowly declining. We will admit the patient for further IV fluids and workup  Lavonia Drafts, MD 08/09/14 1753

## 2014-08-09 NOTE — ED Notes (Signed)
MD notified Pt's HR 140. 2nd EKG performed.

## 2014-08-09 NOTE — ED Notes (Signed)
Pt taken to CT.

## 2014-08-09 NOTE — H&P (Signed)
[facility] NAME: David Beck    MR#:  [id number]  DATE OF BIRTH:  19-Aug-1920  DATE OF ADMISSION:  08/09/2014  PRIMARY CARE PHYSICIAN: Dion Body, MD   REQUESTING/REFERRING PHYSICIAN: Dr. Corky Downs  CHIEF COMPLAINT:   Chief Complaint  Patient presents with  . Dizziness  . Weakness    HISTORY OF PRESENT ILLNESS:  David Beck  is a 81 y.o. male with a known history of DM, lymphoma here with dizziness. Last chemo on Thursday. Patient has been feeling dizzy on standing. Fluids given in the emergency room hoping it would improve his tachycardia and dizziness. The patient continues to be dizzy. Heartrate is as high as 160 in atrial flutter. No cardiac history. No chest pain or shortness of breath. Has generalized weakness. No palpitations or syncope.  PAST MEDICAL HISTORY:   Past Medical History  Diagnosis Date  . Diabetes mellitus without complication   . DJD (degenerative joint disease)   . BPH (benign prostatic hyperplasia)   . Hyperlipidemia   . History of hip replacement   . Pneumonia   . Melanoma   . Small cell b-cell lymphoma     PAST SURGICAL HISTORY:   Past Surgical History  Procedure Laterality Date  . No past surgeries    . Abdominal aortic aneurysm repair      4 years ago  . Joint replacement    . Peripheral vascular catheterization N/A 06/08/2014    Procedure: Glori Luis Cath Insertion;  Surgeon: Algernon Huxley, MD;  Location: [facility];  Service: Cardiovascular;  Laterality: N/A;    SOCIAL HISTORY:   History  Substance Use Topics  . Smoking status: Former Smoker    Types: Cigarettes    Quit date: 01/14/1978  . Smokeless tobacco: Not on file  . Alcohol Use: No    FAMILY HISTORY:   Family History  Problem Relation Age of Onset  . Stomach cancer Mother   . Melanoma Brother   . Diabetes Brother   . CVA Brother     DRUG ALLERGIES:  No Known Allergies  REVIEW OF SYSTEMS:    Review of Systems  Constitutional: Positive for weight loss and malaise/fatigue. Negative for fever and chills.  HENT: Negative for hearing loss and nosebleeds.   Eyes: Negative for blurred vision, double vision and pain.  Respiratory: Negative for cough, hemoptysis, sputum production, shortness of breath and wheezing.   Cardiovascular: Negative for chest pain, palpitations, orthopnea and leg swelling.  Gastrointestinal: Negative for nausea, vomiting, abdominal pain, diarrhea and constipation.  Genitourinary: Negative for dysuria and hematuria.  Musculoskeletal: Positive for joint pain. Negative for myalgias, back pain and falls.  Skin: Negative for rash.  Neurological: Positive for dizziness and weakness. Negative for tremors, sensory change, speech change, focal weakness, seizures and headaches.  Endo/Heme/Allergies: Does not bruise/bleed easily.  Psychiatric/Behavioral: Positive for memory loss. Negative for depression. The patient is not nervous/anxious.     MEDICATIONS AT HOME:   Prior to Admission medications   Medication Sig Start Date End Date Taking? Authorizing Provider  Alum & Mag Hydroxide-Simeth (MAGIC MOUTHWASH) SOLN Take 5 mLs by mouth 4 (four) times daily as needed for mouth pain. 07/26/14  Yes Lloyd Huger, MD  glipiZIDE (GLUCOTROL) 10 MG tablet Take 5 mg by mouth 2 (two) times daily.   Yes Historical Provider, MD  lidocaine-prilocaine (EMLA) cream Apply 1 application topically as needed. Apply to port area 1-2 hours prior to chemotherapy  treatment appointment, cover with plastic wrap. 06/15/14  Yes Lloyd Huger, MD  megestrol (MEGACE) 40 MG/ML suspension Take 10 mLs (400 mg total) by mouth daily. 06/15/14  Yes Lloyd Huger, MD  Oxycodone HCl 10 MG TABS Take 1 tablet (10 mg total) by mouth every 6 (six) hours as needed. Patient taking differently: Take 10 mg by mouth every 6 (six) hours as needed (for pain).  07/14/14  Yes Lloyd Huger, MD  predniSONE  (DELTASONE) 50 MG tablet Take 1 tablet daily for 5 days with each chemotherapy treatment Patient taking differently: Take 50 mg by mouth daily. Pt takes one tablet daily for 5 days with each chemotherapy treatment. 06/01/14  Yes Lloyd Huger, MD  prochlorperazine (COMPAZINE) 10 MG tablet Take 1 tablet (10 mg total) by mouth every 6 (six) hours as needed for nausea or vomiting. 06/22/14  Yes Lloyd Huger, MD  HYDROcodone-acetaminophen (NORCO/VICODIN) 5-325 MG per tablet Take 1 tablet by mouth every 6 (six) hours as needed for moderate pain. Patient not taking: Reported on 08/09/2014 07/13/14   Lloyd Huger, MD      VITAL SIGNS:  Blood pressure 120/74, pulse 112, temperature 97.6 F (36.4 C), temperature source Oral, resp. rate 20, height 5\' 10"  (1.778 m), weight 68.04 kg (150 lb), SpO2 89 %.  PHYSICAL EXAMINATION:  Physical Exam  GENERAL:  81 y.o.-year-old patient lying in the bed with no acute distress. Thin. EYES: Pupils equal, round, reactive to light and accommodation. No scleral icterus. Extraocular muscles intact.  HEENT: Head atraumatic, normocephalic. Oropharynx and nasopharynx clear. No oropharyngeal erythema, dry oral mucosa  NECK:  Supple, no jugular venous distention. No thyroid enlargement, no tenderness.  LUNGS: Normal breath sounds bilaterally, no wheezing, rales, rhonchi. No use of accessory muscles of respiration.  CARDIOVASCULAR: S1, S2 irregular. No murmurs, rubs, or gallops. Tachycardic. ABDOMEN: Soft, nontender, nondistended. Bowel sounds present. No organomegaly or mass.  EXTREMITIES: No pedal edema, cyanosis, or clubbing. + 2 pedal & radial pulses b/l.   NEUROLOGIC: Cranial nerves II through XII are intact. No focal Motor or sensory deficits appreciated b/l PSYCHIATRIC: The patient is alert and oriented x 3. Good affect.  SKIN: No obvious rash, lesion, or ulcer.   LABORATORY PANEL:   CBC  Recent Labs Lab 08/09/14 1207  WBC 12.4*  HGB 10.5*  HCT  31.7*  PLT 95*   ------------------------------------------------------------------------------------------------------------------  Chemistries   Recent Labs Lab 08/09/14 1207  NA 137  K 3.3*  CL 103  CO2 26  GLUCOSE 54*  BUN 16  CREATININE 0.52*  CALCIUM 7.7*  AST 17  ALT 25  ALKPHOS 101  BILITOT 0.7   ------------------------------------------------------------------------------------------------------------------  Cardiac Enzymes No results for input(s): TROPONINI in the last 168 hours. ------------------------------------------------------------------------------------------------------------------  RADIOLOGY:  Dg Chest 2 View  08/09/2014   CLINICAL DATA:  Non-Hodgkin's lymphoma with recent syncopal episode  EXAM: CHEST - 2 VIEW  COMPARISON:  05/24/2008.  FINDINGS: Cardiac shadow is within normal limits. A right chest wall port is noted in satisfactory position. Mild interstitial changes are noted likely of a chronic nature. Blunting of left costophrenic angle is again seen and stable.  IMPRESSION: Chronic changes without acute abnormality.   Electronically Signed   By: Inez Catalina M.D.   On: 08/09/2014 12:45   Ct Angio Chest Pe W/cm &/or Wo Cm  08/09/2014   CLINICAL DATA:  Dizziness  EXAM: CT ANGIOGRAPHY CHEST WITH CONTRAST  TECHNIQUE: Multidetector CT imaging of the chest was performed using  the standard protocol during bolus administration of intravenous contrast. Multiplanar CT image reconstructions and MIPs were obtained to evaluate the vascular anatomy.  CONTRAST:  140mL OMNIPAQUE IOHEXOL 350 MG/ML SOLN  COMPARISON:  06/13/2014 and 04/28/2014  FINDINGS: There are no filling defects in the pulmonary arterial tree to suggest acute pulmonary thromboembolism.  Precarinal lymph node is 7 mm and has markedly reduced in size. Additional precarinal lymph node on image 65 is 10 mm and L is also reduced in size. Sub carinal lymph node is 13 mm and was previously 23 mm based on my  measurements. Soft tissue prominence of the right hilum has improved. Prevascular nodes are smaller.  Interstitial lung disease within the right upper lobe characterized primarily by interlobular septal thickening is again noted. It is not significantly changed. Background of emphysema is noted. Similar interstitial opacities in the peripheral right middle lobe are not significantly changed.  Interstitial changes and confluent consolidation in the left lower lobe has increased since the prior study.  A left pleural effusion has increased in size. It is somewhat loculated as it is lobulated and somewhat partition did.  Low-density areas in the spleen are present. There is an area of wedge-shaped low-density. On a previous PET-CT, the spleen was noted to be hypermetabolic. The low-density area in the spleen is inseparable from the hemidiaphragm. Invasion by tumor is not excluded.  No vertebral compression deformity.  No pneumothorax.  Review of the MIP images confirms the above findings.  IMPRESSION: No evidence of acute pulmonary thromboembolism.  Improve mediastinal adenopathy  Left pleural effusion has enlarged and now is loculated.  Pulmonary parenchymal disease in the left lower lobe has progressed. This may represent tumor infiltration. Similar findings in the right lung are stable.  Low-density area in the spleen is present worrisome for tumor. It is inseparable from the left hemidiaphragm and tumor invasion into the hemidiaphragm is not excluded.   Electronically Signed   By: Marybelle Killings M.D.   On: 08/09/2014 15:33     IMPRESSION AND PLAN:   * Atrial flutter with rapid ventricular rate, new onset - possible cause of his dizziness. Etiology is unclear at this time. No signs of infection. No prior history. Patient is on chemotherapy for lymphoma. Could have new cardiomyopathy. Check echocardiogram. Consult cardiology for further recommendations including anticoagulation. Check troponin. Bolus  Cardizem 10 mg IV once. Admitted to stepdown unit on telemetry. Will start Cardizem drip if she continues to be high.  * Dehydration with positive orthostatic vitals Decreased PO intake IVF. I/Os. Daily weight.  * Anemia of chronic disease Monitor  * lymphoma OP f/u with Dr. Grayland Ormond  * Left pleural effusion . Chronic and now loculated. Likely malignant. Patient is not short of breath and not needing oxygen.   * DM2 SSI, ADA  * DVT prophylaxis Lovenox   All the records are reviewed and case discussed with ED provider. Management plans discussed with the patient, family and they are in agreement.  CODE STATUS: FULL CODE Discussed with patient.  TOTAL CRITICAL CARE TIME TAKING CARE OF THIS PATIENT: 40 minutes.    Hillary Bow R M.D on 08/09/2014 at 5:58 PM  Between 7am to 6pm - Pager - [phone]  After 6pm go to [email or web address] - password [email or web address] [facility]  Office  [phone]  CC: Primary care physician; Dion Body, MD

## 2014-08-09 NOTE — Telephone Encounter (Signed)
I also received a call from [facility] regarding patient and the wife was advised to call EMS for transport to hospital.

## 2014-08-09 NOTE — ED Notes (Signed)
MD made aware pt's BP 83/53. Pt placed in trendelenburg, BP 113/68.

## 2014-08-09 NOTE — ED Notes (Signed)
Per EMS pt presents from home where he became dizzy this morning. EMS states pt had a hard time getting to and from the bathroom. EMS states per patient dizziness is better when he laying down and worse when he is standing. CBG 58. Pt is currently undergoing chemo for lymphoma in his back.

## 2014-08-09 NOTE — ED Notes (Signed)
Patient transported to X-ray 

## 2014-08-09 NOTE — ED Provider Notes (Signed)
[facility] Emergency Department Provider Note   ____________________________________________  Time seen: 12: 10 PM I have reviewed the triage vital signs and the triage nursing note.  HISTORY  Chief Complaint Dizziness and Weakness   Historian Patient and his spouse  HPI David Beck is a 81 y.o. male who is here due to dizziness this morning. They think he might be dehydrated. The lightheadedness is worse with standing. Denies shortness of breath, or cough but he does have a mild nasal drainage. No vomiting or diarrhea. He reports his appetite has been good. Last chemotherapy was last Thursday for lymphoma. Oncologist is Dr. Grayland Ormond. Denies any pain.    Past Medical History  Diagnosis Date  . Diabetes mellitus without complication   . DJD (degenerative joint disease)   . BPH (benign prostatic hyperplasia)   . Hyperlipidemia   . History of hip replacement   . Pneumonia   . Melanoma   . Small cell b-cell lymphoma     Patient Active Problem List   Diagnosis Date Noted  . Protein-calorie malnutrition, severe 08/11/2014  . Dehydration 08/09/2014  . Atrial flutter 08/09/2014  . Benign fibroma of prostate 07/13/2014  . Diabetes mellitus, type 2 07/13/2014  . Arthritis, degenerative 07/13/2014  . Combined fat and carbohydrate induced hyperlipemia 07/13/2014  . Angiopathy 07/13/2014  . DLBCL (diffuse large B cell lymphoma) 06/14/2014  . DHL (diffuse histiocytic lymphoma) 06/07/2014  . H/O total hip arthroplasty 04/06/2014  . Low back pain with sciatica 04/06/2014  . H/O neoplasm 12/19/2013  . Synovitis of knee 06/09/2013    Past Surgical History  Procedure Laterality Date  . No past surgeries    . Abdominal aortic aneurysm repair      4 years ago  . Joint replacement    . Peripheral vascular catheterization N/A 06/08/2014    Procedure: Glori Luis Cath Insertion;  Surgeon: Algernon Huxley, MD;  Location: [facility];  Service:  Cardiovascular;  Laterality: N/A;    Current Outpatient Rx  Name  Route  Sig  Dispense  Refill  . digoxin (LANOXIN) 0.125 MG tablet   Oral   Take 1 tablet (0.125 mg total) by mouth daily.   30 tablet   0   . diltiazem (CARDIZEM CD) 180 MG 24 hr capsule   Oral   Take 1 capsule (180 mg total) by mouth daily.   30 capsule   0   . docusate sodium (COLACE) 100 MG capsule   Oral   Take 1 capsule (100 mg total) by mouth 2 (two) times daily.   10 capsule   0   . feeding supplement, ENSURE ENLIVE, (ENSURE ENLIVE) LIQD   Oral   Take 237 mLs by mouth 3 (three) times daily with meals.   237 mL   12   . levofloxacin (LEVAQUIN) 750 MG tablet   Oral   Take 1 tablet (750 mg total) by mouth daily.   7 tablet   0     Allergies Review of patient's allergies indicates no known allergies.  Family History  Problem Relation Age of Onset  . Stomach cancer Mother   . Melanoma Brother   . Diabetes Brother   . CVA Brother     Social History History  Substance Use Topics  . Smoking status: Former Smoker    Types: Cigarettes    Quit date: 01/14/1978  . Smokeless tobacco: Not on file  . Alcohol Use: No    Review of Systems  Constitutional: Negative for fever. Eyes: Negative for visual changes. ENT: Negative for sore throat. Cardiovascular: Negative for chest pain. Respiratory: Negative for shortness of breath. Gastrointestinal: Negative for abdominal pain, vomiting and diarrhea. Genitourinary: Negative for dysuria. Musculoskeletal: Negative for back pain. Skin: Negative for rash. Neurological: Negative for headaches, focal weakness or numbness. 10 point Review of Systems otherwise negative ____________________________________________   PHYSICAL EXAM:  VITAL SIGNS: ED Triage Vitals  Enc Vitals Group     BP --      Pulse --      Resp --      Temp 08/09/14 1154 97.6 F (36.4 C)     Temp Source 08/09/14 1154 Oral     SpO2 --      Weight 08/09/14 1154 150 lb (68.04  kg)     Height 08/09/14 1154 5\' 10"  (1.778 m)     Head Cir --      Peak Flow --      Pain Score --      Pain Loc --      Pain Edu? --      Excl. in [place]? --      Constitutional: Alert and oriented. Cachectic. No acute distress. Eyes: Conjunctivae are normal. PERRL. Normal extraocular movements. ENT   Head: Normocephalic and atraumatic.   Nose: No congestion/rhinnorhea.   Mouth/Throat: Mucous membranes are mildly dry..   Neck: No stridor. Cardiovascular/Chest: Normal rate, regular rhythm.  No murmurs, rubs, or gallops. Respiratory: Mild tachypnea. No retractions. Breath sounds are clear and equal bilaterally. No wheezes/rales/rhonchi. Gastrointestinal: Soft. No distention, no guarding, no rebound. Nontender   Genitourinary/rectal:Deferred Musculoskeletal: Nontender with normal range of motion in all extremities. No joint effusions. 2+ pitting edema bilateral lower extremities without calf tenderness. Neurologic:  Normal speech and language. No gross or focal neurologic deficits are appreciated. Skin:  Skin is warm, dry and intact. No rash noted. Psychiatric: Mood and affect are normal. Speech and behavior are normal. Patient exhibits appropriate insight and judgment.  ____________________________________________   EKG I, Lisa Roca, MD, the attending physician have personally viewed and interpreted all ECGs.  70 bpm. Sinus rhythm. Narrow QRS normal axis. Normal ST and T-wave.  Rhythm strip interpretation: 67 bpm. Irregular. Normal sinus with frequent PVCs. ____________________________________________  LABS (pertinent positives/negatives)  Urinalysis negative Metabolic panel significant for potassium 3.3, glucose 54 White blood cell count 12.4, hemoglobin 10.5  ____________________________________________  RADIOLOGY All Xrays were viewed by me. Imaging interpreted by Radiologist.  Chest x-ray: Chronic changes without acute  abnormality __________________________________________  PROCEDURES  Procedure(s) performed: None Critical Care performed: None  ____________________________________________   ED COURSE / ASSESSMENT AND PLAN  CONSULTATIONS: None  Pertinent labs & imaging results that were available during my care of the patient were reviewed by me and considered in my medical decision making (see chart for details).  Patient's complaint of dizziness is likely coming from his hypotension. I suspect it's most likely due to dehydration, however with the tachypnea we'll check a chest x-ray. We'll also check for any other signs of infection or hypoperfusion.   Patient care signed out to Dr. Corky Downs.  Patient / Family / Caregiver informed of clinical course, medical decision-making process, and agree with plan.   I discussed return precautions, follow-up instructions, and discharged instructions with patient and/or family.  ___________________________________________   FINAL CLINICAL IMPRESSION(S) / ED DIAGNOSES   Final diagnoses:  Dehydration  Orthostatic hypotension       Lisa Roca, MD 08/13/14 0900

## 2014-08-09 NOTE — Progress Notes (Signed)
Troponin .45, Lactic acid 3.3, notified Prime MD on call. Will reassess follow up labs.

## 2014-08-09 NOTE — ED Notes (Addendum)
Admitting MD notified of Lactic Acid 3.0. Admitting MD at bedside.

## 2014-08-09 NOTE — Telephone Encounter (Signed)
Pt under the care of Dr. Grayland Ormond. Will forward to his team.

## 2014-08-09 NOTE — Telephone Encounter (Signed)
CAlled Vicky to inform her of pt to ER

## 2014-08-09 NOTE — ED Notes (Signed)
Pt placed on bedpan

## 2014-08-10 DIAGNOSIS — J189 Pneumonia, unspecified organism: Secondary | ICD-10-CM | POA: Diagnosis present

## 2014-08-10 DIAGNOSIS — Z87891 Personal history of nicotine dependence: Secondary | ICD-10-CM | POA: Diagnosis not present

## 2014-08-10 DIAGNOSIS — Z79891 Long term (current) use of opiate analgesic: Secondary | ICD-10-CM | POA: Diagnosis not present

## 2014-08-10 DIAGNOSIS — Z833 Family history of diabetes mellitus: Secondary | ICD-10-CM | POA: Diagnosis not present

## 2014-08-10 DIAGNOSIS — Z85820 Personal history of malignant melanoma of skin: Secondary | ICD-10-CM | POA: Diagnosis not present

## 2014-08-10 DIAGNOSIS — I38 Endocarditis, valve unspecified: Secondary | ICD-10-CM | POA: Diagnosis present

## 2014-08-10 DIAGNOSIS — E785 Hyperlipidemia, unspecified: Secondary | ICD-10-CM | POA: Diagnosis present

## 2014-08-10 DIAGNOSIS — Z79899 Other long term (current) drug therapy: Secondary | ICD-10-CM | POA: Diagnosis not present

## 2014-08-10 DIAGNOSIS — I951 Orthostatic hypotension: Secondary | ICD-10-CM | POA: Diagnosis present

## 2014-08-10 DIAGNOSIS — E119 Type 2 diabetes mellitus without complications: Secondary | ICD-10-CM | POA: Diagnosis present

## 2014-08-10 DIAGNOSIS — J90 Pleural effusion, not elsewhere classified: Secondary | ICD-10-CM | POA: Diagnosis present

## 2014-08-10 DIAGNOSIS — Z800 Family history of malignant neoplasm of digestive organs: Secondary | ICD-10-CM | POA: Diagnosis not present

## 2014-08-10 DIAGNOSIS — E860 Dehydration: Secondary | ICD-10-CM | POA: Diagnosis present

## 2014-08-10 DIAGNOSIS — D638 Anemia in other chronic diseases classified elsewhere: Secondary | ICD-10-CM | POA: Diagnosis present

## 2014-08-10 DIAGNOSIS — Z8679 Personal history of other diseases of the circulatory system: Secondary | ICD-10-CM | POA: Diagnosis not present

## 2014-08-10 DIAGNOSIS — I248 Other forms of acute ischemic heart disease: Secondary | ICD-10-CM | POA: Diagnosis present

## 2014-08-10 DIAGNOSIS — E43 Unspecified severe protein-calorie malnutrition: Secondary | ICD-10-CM | POA: Diagnosis present

## 2014-08-10 DIAGNOSIS — I4891 Unspecified atrial fibrillation: Secondary | ICD-10-CM | POA: Diagnosis present

## 2014-08-10 DIAGNOSIS — Z79818 Long term (current) use of other agents affecting estrogen receptors and estrogen levels: Secondary | ICD-10-CM | POA: Diagnosis not present

## 2014-08-10 DIAGNOSIS — Z823 Family history of stroke: Secondary | ICD-10-CM | POA: Diagnosis not present

## 2014-08-10 DIAGNOSIS — Z808 Family history of malignant neoplasm of other organs or systems: Secondary | ICD-10-CM | POA: Diagnosis not present

## 2014-08-10 DIAGNOSIS — Z96649 Presence of unspecified artificial hip joint: Secondary | ICD-10-CM | POA: Diagnosis present

## 2014-08-10 DIAGNOSIS — I4892 Unspecified atrial flutter: Secondary | ICD-10-CM | POA: Diagnosis present

## 2014-08-10 DIAGNOSIS — M1990 Unspecified osteoarthritis, unspecified site: Secondary | ICD-10-CM | POA: Diagnosis present

## 2014-08-10 DIAGNOSIS — C8330 Diffuse large B-cell lymphoma, unspecified site: Secondary | ICD-10-CM | POA: Diagnosis present

## 2014-08-10 DIAGNOSIS — Z6821 Body mass index (BMI) 21.0-21.9, adult: Secondary | ICD-10-CM | POA: Diagnosis not present

## 2014-08-10 DIAGNOSIS — Z9181 History of falling: Secondary | ICD-10-CM | POA: Diagnosis not present

## 2014-08-10 DIAGNOSIS — N400 Enlarged prostate without lower urinary tract symptoms: Secondary | ICD-10-CM | POA: Diagnosis present

## 2014-08-10 LAB — CBC
HCT: 32.4 % — ABNORMAL LOW (ref 40.0–52.0)
Hemoglobin: 10.8 g/dL — ABNORMAL LOW (ref 13.0–18.0)
MCH: 28.2 pg (ref 26.0–34.0)
MCHC: 33.3 g/dL (ref 32.0–36.0)
MCV: 84.6 fL (ref 80.0–100.0)
Platelets: 81 10*3/uL — ABNORMAL LOW (ref 150–440)
RBC: 3.83 MIL/uL — ABNORMAL LOW (ref 4.40–5.90)
RDW: 20.8 % — ABNORMAL HIGH (ref 11.5–14.5)
WBC: 5.5 10*3/uL (ref 3.8–10.6)

## 2014-08-10 LAB — BASIC METABOLIC PANEL
ANION GAP: 6 (ref 5–15)
BUN: 10 mg/dL (ref 6–20)
CALCIUM: 7.4 mg/dL — AB (ref 8.9–10.3)
CHLORIDE: 109 mmol/L (ref 101–111)
CO2: 24 mmol/L (ref 22–32)
CREATININE: 0.41 mg/dL — AB (ref 0.61–1.24)
GFR calc Af Amer: >60 mL/min (ref >60)
Glucose, Bld: 43 mg/dL — CL (ref 65–99)
Potassium: 3.4 mmol/L — ABNORMAL LOW (ref 3.5–5.1)
SODIUM: 139 mmol/L (ref 135–145)

## 2014-08-10 LAB — GLUCOSE, CAPILLARY
GLUCOSE-CAPILLARY: 75 mg/dL (ref 65–99)
Glucose-Capillary: 55 mg/dL — ABNORMAL LOW (ref 65–99)
Glucose-Capillary: 68 mg/dL (ref 65–99)
Glucose-Capillary: 124 mg/dL — ABNORMAL HIGH (ref 65–99)
Glucose-Capillary: 143 mg/dL — ABNORMAL HIGH (ref 65–99)
Glucose-Capillary: 163 mg/dL — ABNORMAL HIGH (ref 65–99)

## 2014-08-10 LAB — TROPONIN I
TROPONIN I: 0.03 ng/mL (ref <0.031)
Troponin I: <0.03 ng/mL (ref <0.031)

## 2014-08-10 MED ORDER — ENSURE ENLIVE PO LIQD
237.0000 mL | Freq: Three times a day (TID) | ORAL | Status: DC
Start: 2014-08-10 — End: 2014-08-13
  Administered 2014-08-10 – 2014-08-13 (×11): 237 mL via ORAL

## 2014-08-10 MED ORDER — SODIUM CHLORIDE 0.9 % IV BOLUS (SEPSIS)
1000.0000 mL | INTRAVENOUS | Status: DC | PRN
  Administered 2014-08-10: 1000 mL via INTRAVENOUS
  Filled 2014-08-10: qty 1000

## 2014-08-10 MED ORDER — DEXTROSE 5 % IV SOLN
500.0000 mg | INTRAVENOUS | Status: DC
  Administered 2014-08-10 – 2014-08-11 (×2): 500 mg via INTRAVENOUS
  Filled 2014-08-10 (×3): qty 500

## 2014-08-10 MED ORDER — DIGOXIN 0.25 MG/ML IJ SOLN
0.2500 mg | Freq: Once | INTRAMUSCULAR | Status: AC
  Administered 2014-08-10: 0.25 mg via INTRAVENOUS

## 2014-08-10 MED ORDER — CEFTRIAXONE SODIUM IN DEXTROSE 20 MG/ML IV SOLN
1.0000 g | INTRAVENOUS | Status: DC
  Filled 2014-08-10 (×2): qty 50

## 2014-08-10 MED ORDER — DEXTROSE 5 % IV SOLN
1.0000 g | INTRAVENOUS | Status: DC
  Administered 2014-08-10 – 2014-08-11 (×2): 1 g via INTRAVENOUS
  Filled 2014-08-10 (×3): qty 10

## 2014-08-10 MED ORDER — DIGOXIN 0.25 MG/ML IJ SOLN
INTRAMUSCULAR | Status: AC
  Administered 2014-08-10: 0.25 mg via INTRAVENOUS
  Filled 2014-08-10: qty 2

## 2014-08-10 MED ORDER — SODIUM CHLORIDE 0.9 % IV BOLUS (SEPSIS)
500.0000 mL | Freq: Once | INTRAVENOUS | Status: AC
  Administered 2014-08-10: 500 mL via INTRAVENOUS

## 2014-08-10 NOTE — Progress Notes (Signed)
Inpatient Diabetes Program Recommendations  AACE/ADA: New Consensus Statement on Inpatient Glycemic Control (2013)  Target Ranges:  Prepandial:   less than 140 mg/dL      Peak postprandial:   less than 180 mg/dL (1-2 hours)      Critically ill patients:  140 - 180 mg/dL  Results for David Beck, David Beck (MRN [id number]) as of 08/10/2014 08:19  Ref. Range 08/09/2014 21:55 08/10/2014 06:48 08/10/2014 07:26 08/10/2014 07:52  Glucose-Capillary Latest Ref Range: 65-99 mg/dL 86 55 (L) 68 75   Results for David Beck, David Beck (MRN [id number]) as of 08/10/2014 08:19  Ref. Range 08/09/2014 12:07 08/10/2014 03:20  Glucose Latest Ref Range: 65-99 mg/dL 54 (L) 43 (LL)    Diabetes history: DM2 Outpatient Diabetes medications: Glipizide 5 mg BID Current orders for Inpatient glycemic control: Glipizide 5 mg BID, Novolog 0-9 units TID with meals  Inpatient Diabetes Program Recommendations Oral Agents: Initial glucose 54 mg/dl on 7/27, 43 mg/dl today at 3:20, and 55 mg/dl today at 6:48. Patient received evening dose of Glipizide last night. Please discontinue Glipizide at this time and use Novolog correction scale for inpatient glycemic control.  Thanks, Barnie Alderman, RN, MSN, CCRN, CDE Diabetes Coordinator Inpatient Diabetes Program [phone] (Team Pager from [place] to [place]) [phone] ([facility] office) [phone] [facility] office) [phone] [facility] office)'

## 2014-08-10 NOTE — Progress Notes (Signed)
Initial Nutrition Assessment  DOCUMENTATION CODES:   Severe malnutrition in context of chronic illness  INTERVENTION:  1) Medical Food Supplement Therapy: recommend Ensure Enlive po TID, each supplement provides 350 kcal and 20 grams of protein 2) Meals and Snacks: Cater to patient preferences; pt missing bottom dentures; discussed sending all meats ground, soft foods. Pt agreeable to this plan. Recommend downgrading diet to Dysphagia III with Ground Meats. Recommend removal of diabetic restriction as pt with poor intake, wt loss, episodes of hypoglycemia.    NUTRITION DIAGNOSIS:   Inadequate oral intake related to chronic illness as evidenced by per patient/family report, percent weight loss, moderate depletion of body fat, severe depletion of body fat, severe depletion of muscle mass.  GOAL:   Patient will meet greater than or equal to 90% of their needs  MONITOR:    (Energy Intake, Anthropometrics, Digestive System, Electrolyte/Renal Profile, Glucose Profile)  REASON FOR ASSESSMENT:   Malnutrition Screening Tool    ASSESSMENT:    Pt admitted with dizziness, weakness, dehydration  Past Medical History  Diagnosis Date  . Diabetes mellitus without complication   . DJD (degenerative joint disease)   . BPH (benign prostatic hyperplasia)   . Hyperlipidemia   . History of hip replacement   . Pneumonia   . Melanoma   . Small cell b-cell lymphoma     Diet Order: Carb Controlled  Energy Intake: pt able to eat some breakfast this AM, ate banana, toast and some cereal. Pt reports difficulty chewing due to missing bottom dentures; reports he had them in the ED last night, staff aware and trying to locate pt dentures. Pt reports he can chew soft foods at present.   Food and Nutrition related history: pt reports appetite has been down since beginning of the year (Jan/Feb); pt preoccupied by loss of dentures, unable to get full diet recall from pt  Nutrition Focused Physical  Exam: Nutrition-Focused physical exam completed. Findings are moderate to severe fat depletion, severe muscle depletion, and moderate edema.   Electrolyte and Renal Profile:  Recent Labs Lab 08/09/14 1207 08/10/14 0320  BUN 16 10  CREATININE 0.52* 0.41*  NA 137 139  K 3.3* 3.4*   Glucose Profile:   Recent Labs  08/10/14 0726 08/10/14 0752 08/10/14 1230  GLUCAP 68 75 124*   Protein Profile:   Recent Labs Lab 08/09/14 1207  ALBUMIN 2.8*   Meds:  NS at 75 ml/hr, ss novolog, megace, magic mouthwash, colace  Skin:  Reviewed, no issues  Last BM:  7/28   Height:   Ht Readings from Last 1 Encounters:  08/09/14 5\' 10"  (1.778 m)    Weight: pt reports 50 pound wt loss since January; 24.5% wt loss  Wt Readings from Last 1 Encounters:  08/10/14 151 lb 7.3 oz (68.7 kg)   Filed Weights   08/09/14 1154 08/10/14 0500  Weight: 150 lb (68.04 kg) 151 lb 7.3 oz (68.7 kg)    BMI:  Body mass index is 21.73 kg/(m^2).  Estimated Nutritional Needs:   Kcal:  5361-4431 kcals (BEE 1343, 1.2 AF, 1.1-1.3 IF)   Protein:  76-97 g (1.1-1.4 g/kg)   Fluid:  1725-2070 mL (25-30 ml/kg)   HIGH Care Level  Kerman Passey MS, RD, LDN [phone] Pager

## 2014-08-10 NOTE — Care Management (Signed)
Notified by Santiago Glad with [facility] that this patient is currently open to [facility]; resumption orders needed if this is appropriate at discharge.

## 2014-08-10 NOTE — Progress Notes (Signed)
Visit made. Mr. David Beck is followed at home by [facility], currently with nursing services. He is currently under going chemotherapy at the [facility] for treatment of lymphoma, next treatment scheduled for 08/24/14. Mr. David Beck came to [facility] ED via EMS from his home on 7/27 for evaluation of dizziness and severe weakness. Per chart notes he is being treated for pneumonia, dehydration, irregular heart rate and low blood sugar. Patient seen lying in bed, alert and oriented, states he "feels much better" and would like to go home. Mr. David Beck expressed his appreciation for the visit. No discharge planned at this time. Will continue to follow through final disposition. Thank you. Flo Shanks RN, BSN, [facility] Liaison [phone] c

## 2014-08-10 NOTE — Consult Note (Signed)
Date: 08/10/2014,   MRN# [id number] David Beck Jun 19, 1920 Code Status:     Code Status Orders        Start     Ordered   08/09/14 1756  Full code   Continuous     08/09/14 1757    Advance Directive Documentation        Most Recent Value   Type of Advance Directive  Healthcare Power of Attorney   Pre-existing out of facility DNR order (yellow form or pink MOST form)     "MOST" Form in Place?       Hosp day:@LENGTHOFSTAYDAYS @ Referring MD: @ATDPROV @     PCP:      AdmissionWeight: 150 lb (68.04 kg)                 CurrentWeight: 151 lb 7.3 oz (68.7 kg)      CHIEF COMPLAINT:   Acute dizziness   HISTORY OF PRESENT ILLNESS   81 y.o. male with a known history of DM, recent dx of lymphoma admitted to ICU for here with dizziness and low blood pressure. Last chemo on Thursday. Patient has been feeling dizzy on standing.  Patient given IVF's but remained dizzy. Patient has no other ROS, no fevers, chills, NVD.    Heartrate is as high as 160 in atrial flutter.patient currently alert and awake, no apparent distress PAST MEDICAL HISTORY:          PAST MEDICAL HISTORY   Past Medical History  Diagnosis Date  . Diabetes mellitus without complication   . DJD (degenerative joint disease)   . BPH (benign prostatic hyperplasia)   . Hyperlipidemia   . History of hip replacement   . Pneumonia   . Melanoma   . Small cell b-cell lymphoma      SURGICAL HISTORY   Past Surgical History  Procedure Laterality Date  . No past surgeries    . Abdominal aortic aneurysm repair      4 years ago  . Joint replacement    . Peripheral vascular catheterization N/A 06/08/2014    Procedure: Glori Luis Cath Insertion;  Surgeon: Algernon Huxley, MD;  Location: [place] [facility];  Service: Cardiovascular;  Laterality: N/A;     FAMILY HISTORY   Family History  Problem Relation Age of Onset  . Stomach cancer Mother   . Melanoma Brother   . Diabetes Brother   . CVA Brother       SOCIAL HISTORY   History  Substance Use Topics  . Smoking status: Former Smoker    Types: Cigarettes    Quit date: 01/14/1978  . Smokeless tobacco: Not on file  . Alcohol Use: No     MEDICATIONS    Home Medication:  No current outpatient prescriptions on file.  Current Medication:  Current facility-administered medications:  .  0.9 %  sodium chloride infusion, , Intravenous, Continuous, Srikar Sudini, MD, Last Rate: 75 mL/hr at 08/10/14 0800 .  acetaminophen (TYLENOL) tablet 650 mg, 650 mg, Oral, Q6H PRN **OR** acetaminophen (TYLENOL) suppository 650 mg, 650 mg, Rectal, Q6H PRN, Srikar Sudini, MD .  albuterol (PROVENTIL) (2.5 MG/3ML) 0.083% nebulizer solution 2.5 mg, 2.5 mg, Nebulization, Q2H PRN, Srikar Sudini, MD .  antiseptic oral rinse (CPC / CETYLPYRIDINIUM CHLORIDE 0.05%) solution 7 mL, 7 mL, Mouth Rinse, BID, Srikar Sudini, MD, 7 mL at 08/09/14 2200 .  diltiazem (CARDIZEM) tablet 30 mg, 30 mg, Oral, 4 times per day, Hillary Bow, MD, 30 mg at 08/09/14 2356 .  docusate sodium (COLACE) capsule 100 mg, 100 mg, Oral, BID, Srikar Sudini, MD, 100 mg at 08/09/14 2200 .  enoxaparin (LOVENOX) injection 40 mg, 40 mg, Subcutaneous, Q24H, Srikar Sudini, MD, 40 mg at 08/10/14 0454 .  HYDROcodone-acetaminophen (NORCO/VICODIN) 5-325 MG per tablet 1 tablet, 1 tablet, Oral, Q6H PRN, Srikar Sudini, MD .  insulin aspart (novoLOG) injection 0-9 Units, 0-9 Units, Subcutaneous, TID WC, Hillary Bow, MD, 0 Units at 08/10/14 0825 .  LORazepam (ATIVAN) tablet 0.5 mg, 0.5 mg, Oral, QHS PRN, Hillary Bow, MD, 0.5 mg at 08/09/14 2150 .  magic mouthwash, 5 mL, Oral, QID PRN, Hillary Bow, MD .  megestrol (MEGACE) 40 MG/ML suspension 400 mg, 400 mg, Oral, Daily, Srikar Sudini, MD, 400 mg at 08/09/14 2013 .  ondansetron (ZOFRAN) tablet 4 mg, 4 mg, Oral, Q6H PRN **OR** ondansetron (ZOFRAN) injection 4 mg, 4 mg, Intravenous, Q6H PRN, Srikar Sudini, MD .  oxyCODONE (Oxy IR/ROXICODONE) immediate  release tablet 10 mg, 10 mg, Oral, Q6H PRN, Srikar Sudini, MD .  sodium chloride 0.9 % bolus 1,000 mL, 1,000 mL, Intravenous, PRN, Harrie Foreman, MD, 1,000 mL at 08/10/14 0330    ALLERGIES   Review of patient's allergies indicates no known allergies.     REVIEW OF SYSTEMS   Review of Systems  Constitutional: Positive for malaise/fatigue. Negative for fever, chills and weight loss.  HENT: Negative for hearing loss and tinnitus.   Eyes: Negative for blurred vision and double vision.  Respiratory: Positive for sputum production. Negative for cough, hemoptysis, shortness of breath and wheezing.   Cardiovascular: Positive for leg swelling. Negative for chest pain, palpitations and orthopnea.  Gastrointestinal: Negative for heartburn, nausea and vomiting.  Genitourinary: Negative for dysuria and urgency.  Musculoskeletal: Negative for myalgias and neck pain.  Skin: Negative for itching and rash.  Neurological: Positive for dizziness and weakness. Negative for tingling, tremors, sensory change, speech change and headaches.  Endo/Heme/Allergies: Negative for environmental allergies. Does not bruise/bleed easily.  Psychiatric/Behavioral: Negative for depression and suicidal ideas.     VS: BP 113/63 mmHg  Pulse 40  Temp(Src) 97.5 F (36.4 C) (Oral)  Resp 14  Ht 5\' 10"  (1.778 m)  Wt 151 lb 7.3 oz (68.7 kg)  BMI 21.73 kg/m2  SpO2 95%     PHYSICAL EXAM  Physical Exam  Constitutional: He is oriented to person, place, and time. No distress.  HENT:  Head: Normocephalic and atraumatic.  Mouth/Throat: No oropharyngeal exudate.  Eyes: Conjunctivae and EOM are normal. Pupils are equal, round, and reactive to light. No scleral icterus.  Neck: Normal range of motion. Neck supple.  Cardiovascular: Normal rate, regular rhythm and normal heart sounds.   No murmur heard. Pulmonary/Chest: Effort normal. No respiratory distress. He has no wheezes. He has rales.  Abdominal: Soft. Bowel  sounds are normal. He exhibits no distension. There is no tenderness. There is no rebound.  Musculoskeletal: He exhibits edema.  Neurological: He is alert and oriented to person, place, and time. He displays normal reflexes. No cranial nerve deficit. Coordination normal.  Skin: Skin is warm and dry. He is not diaphoretic.        LABS    Recent Labs     08/09/14  1207  08/10/14  0320  HGB  10.5*  10.8*  HCT  31.7*  32.4*  MCV  83.3  84.6  WBC  12.4*  5.5  BUN  16  10  CREATININE  0.52*  0.41*  GLUCOSE  54*  43*  CALCIUM  7.7*  7.4*  ,    No results for input(s): PH in the last 72 hours.  Invalid input(s): PCO2, PO2, BASEEXCESS, BASEDEFICITE, TFT    CULTURE RESULTS   Recent Results (from the past 240 hour(s))  MRSA PCR Screening     Status: None   Collection Time: 08/09/14  7:58 PM  Result Value Ref Range Status   MRSA by PCR NEGATIVE NEGATIVE Final    Comment:        The GeneXpert MRSA Assay (FDA approved for NASAL specimens only), is one component of a comprehensive MRSA colonization surveillance program. It is not intended to diagnose MRSA infection nor to guide or monitor treatment for MRSA infections.           IMAGING    Dg Chest 2 View  08/09/2014   CLINICAL DATA:  Non-Hodgkin's lymphoma with recent syncopal episode  EXAM: CHEST - 2 VIEW  COMPARISON:  05/24/2008.  FINDINGS: Cardiac shadow is within normal limits. A right chest wall port is noted in satisfactory position. Mild interstitial changes are noted likely of a chronic nature. Blunting of left costophrenic angle is again seen and stable.  IMPRESSION: Chronic changes without acute abnormality.   Electronically Signed   By: Inez Catalina M.D.   On: 08/09/2014 12:45   Ct Angio Chest Pe W/cm &/or Wo Cm  08/09/2014   CLINICAL DATA:  Dizziness  EXAM: CT ANGIOGRAPHY CHEST WITH CONTRAST  TECHNIQUE: Multidetector CT imaging of the chest was performed using the standard protocol during bolus  administration of intravenous contrast. Multiplanar CT image reconstructions and MIPs were obtained to evaluate the vascular anatomy.  CONTRAST:  120mL OMNIPAQUE IOHEXOL 350 MG/ML SOLN  COMPARISON:  06/13/2014 and 04/28/2014  FINDINGS: There are no filling defects in the pulmonary arterial tree to suggest acute pulmonary thromboembolism.  Precarinal lymph node is 7 mm and has markedly reduced in size. Additional precarinal lymph node on image 65 is 10 mm and L is also reduced in size. Sub carinal lymph node is 13 mm and was previously 23 mm based on my measurements. Soft tissue prominence of the right hilum has improved. Prevascular nodes are smaller.  Interstitial lung disease within the right upper lobe characterized primarily by interlobular septal thickening is again noted. It is not significantly changed. Background of emphysema is noted. Similar interstitial opacities in the peripheral right middle lobe are not significantly changed.  Interstitial changes and confluent consolidation in the left lower lobe has increased since the prior study.  A left pleural effusion has increased in size. It is somewhat loculated as it is lobulated and somewhat partition did.  Low-density areas in the spleen are present. There is an area of wedge-shaped low-density. On a previous PET-CT, the spleen was noted to be hypermetabolic. The low-density area in the spleen is inseparable from the hemidiaphragm. Invasion by tumor is not excluded.  No vertebral compression deformity.  No pneumothorax.  Review of the MIP images confirms the above findings.  IMPRESSION: No evidence of acute pulmonary thromboembolism.  Improve mediastinal adenopathy  Left pleural effusion has enlarged and now is loculated.  Pulmonary parenchymal disease in the left lower lobe has progressed. This may represent tumor infiltration. Similar findings in the right lung are stable.  Low-density area in the spleen is present worrisome for tumor. It is inseparable  from the left hemidiaphragm and tumor invasion into the hemidiaphragm is not excluded.   Electronically Signed   By: Rodena Goldmann.D.  On: 08/09/2014 15:33        ASSESSMENT/PLAN   81 yo white male admitted for acute dizziness and acute aflutter likely from acute pneumnia with dehydration  1.recommend starting IV abx  2.oxygen as needed 3.incentive spirometry to bedside 4.BD therapy as needed 5.IVF's as prescribed  I have personally obtained a history, examined the patient, evaluated laboratory and independently reviewed imaging results, formulated the assessment and plan and placed orders.  The Patient requires high complexity decision making for assessment and support, frequent evaluation and titration of therapies, application of advanced monitoring technologies and extensive interpretation of multiple databases. Time spent with patient  45 mins.  Patient is satisfied with Plan of action and management.    Corrin Parker, M.D.  [facility]  Medical Director [place] Director [facility] Cardio-Pulmonary Department

## 2014-08-10 NOTE — Progress Notes (Signed)
Pt rested most of the night, AOx4, bolus given for hypotension, tolerated fluids, no CO pain/distress. Lab had trouble getting blood, order to access port placed.

## 2014-08-10 NOTE — H&P (Signed)
Pt has converted back to A-fib (previously converted back to NSR from A-fib), rate remains controlled, VSS.  Notified Dr. Benjie Karvonen, per [facility] obtain cardiology consult, ECHOCARDIOGRAM already obtained this admission.

## 2014-08-10 NOTE — Plan of Care (Signed)
Problem: Phase I Progression Outcomes Goal: Dyspnea controlled at rest Outcome: Progressing Pt experiences some sob with exertion    Goal: Pain controlled with appropriate interventions Outcome: Completed/Met Date Met:  08/10/14 Pt has no complaints of pain    Goal: OOB as tolerated unless otherwise ordered Outcome: Not Met (add Reason) Pt unable to tolerate ambulating at this time due to tachycardia with exertion Goal: Consider pulmonary consult Outcome: Completed/Met Date Met:  08/10/14 Dr. Mortimer Fries consulted and seen pt Goal: Code status addressed with pt/family Outcome: Progressing Pt remains full code at this time    Goal: Hemodynamically stable Outcome: Progressing VSS, pt does have some nonsustained tachycardia with exertion  Problem: Phase II Progression Outcomes Goal: Wean O2 if indicated Outcome: Not Met (add Reason) Pt remains on 2L [place]

## 2014-08-10 NOTE — Progress Notes (Signed)
Blood sugar 43, gave apple juice, recheck was 54, gave more apple juice. Reported to [place] at shift change to re-check blood sugar again.

## 2014-08-10 NOTE — Consult Note (Signed)
[facility] Consultation Note  Patient ID: David Beck, MRN: [id number], DOB/AGE: [id number] 79 y.o. Admit date: 08/09/2014   Date of Consult: 08/10/2014 Primary Physician: Dion Body, MD Primary Cardiologist: None  Chief Complaint:  Chief Complaint  Patient presents with  . Dizziness  . Weakness   Reason for Consult: syncope with atrial fibrillation  HPI: 81 y.o. male with known diabetes and small cell B cancer with recent increase in weakness and fatigue and orthostatic Hypotension. The patient was hydrated quite well but still had episodes of syncope. This syncope may or may not be related to atrial fibrillation with rapid ventricular rate for which the patient has had some waxing and waning of blood pressure and rhythm disturbances at this time. The patient has been given digoxin with some improvements. The patient with hydration has significant improvements as well. The patient may haven't infection causing this although there is no evidence at this time of new infection.  Past Medical History  Diagnosis Date  . Diabetes mellitus without complication   . DJD (degenerative joint disease)   . BPH (benign prostatic hyperplasia)   . Hyperlipidemia   . History of hip replacement   . Pneumonia   . Melanoma   . Small cell b-cell lymphoma       Surgical History:  Past Surgical History  Procedure Laterality Date  . No past surgeries    . Abdominal aortic aneurysm repair      4 years ago  . Joint replacement    . Peripheral vascular catheterization N/A 06/08/2014    Procedure: Glori Luis Cath Insertion;  Surgeon: Algernon Huxley, MD;  Location: [facility];  Service: Cardiovascular;  Laterality: N/A;     Home Meds: Prior to Admission medications   Medication Sig Start Date End Date Taking? Authorizing Provider  Alum & Mag Hydroxide-Simeth (MAGIC MOUTHWASH) SOLN Take 5 mLs by mouth 4 (four) times daily as needed for mouth pain. 07/26/14  Yes Lloyd Huger, MD  glipiZIDE (GLUCOTROL) 10 MG tablet Take 5 mg by mouth 2 (two) times daily.   Yes Historical Provider, MD  lidocaine-prilocaine (EMLA) cream Apply 1 application topically as needed. Apply to port area 1-2 hours prior to chemotherapy treatment appointment, cover with plastic wrap. 06/15/14  Yes Lloyd Huger, MD  megestrol (MEGACE) 40 MG/ML suspension Take 10 mLs (400 mg total) by mouth daily. 06/15/14  Yes Lloyd Huger, MD  Oxycodone HCl 10 MG TABS Take 1 tablet (10 mg total) by mouth every 6 (six) hours as needed. Patient taking differently: Take 10 mg by mouth every 6 (six) hours as needed (for pain).  07/14/14  Yes Lloyd Huger, MD  predniSONE (DELTASONE) 50 MG tablet Take 1 tablet daily for 5 days with each chemotherapy treatment Patient taking differently: Take 50 mg by mouth daily. Pt takes one tablet daily for 5 days with each chemotherapy treatment. 06/01/14  Yes Lloyd Huger, MD  prochlorperazine (COMPAZINE) 10 MG tablet Take 1 tablet (10 mg total) by mouth every 6 (six) hours as needed for nausea or vomiting. 06/22/14  Yes Lloyd Huger, MD    Inpatient Medications:  . antiseptic oral rinse  7 mL Mouth Rinse BID  . azithromycin  500 mg Intravenous Q24H  . cefTRIAXone (ROCEPHIN) IVPB 1 gram/50 mL D5W  1 g Intravenous Q24H  . diltiazem  30 mg Oral 4 times per day  . docusate sodium  100 mg Oral BID  . enoxaparin (  LOVENOX) injection  40 mg Subcutaneous Q24H  . feeding supplement (ENSURE ENLIVE)  237 mL Oral TID WC  . insulin aspart  0-9 Units Subcutaneous TID WC  . megestrol  400 mg Oral Daily   . sodium chloride 75 mL/hr at 08/10/14 1600    Allergies: No Known Allergies  History   Social History  . Marital Status: Married    Spouse Name: N/A  . Number of Children: N/A  . Years of Education: N/A   Occupational History  . Not on file.   Social History Main Topics  . Smoking status: Former Smoker    Types: Cigarettes    Quit date:  01/14/1978  . Smokeless tobacco: Not on file  . Alcohol Use: No  . Drug Use: No  . Sexual Activity: Not on file   Other Topics Concern  . Not on file   Social History Narrative     Family History  Problem Relation Age of Onset  . Stomach cancer Mother   . Melanoma Brother   . Diabetes Brother   . CVA Brother      Review of Systems Positive for weakness and fatigue and syncope Negative for: General:  chills, fever, night sweats or weight changes.  Cardiovascular: PND orthopnea   Dermatological skin lesions rashes Respiratory: Cough congestion Urologic: Frequent urination urination at night and hematuria Abdominal: negative for nausea, vomiting, diarrhea, bright red blood per rectum, melena, or hematemesis Neurologic: negative for visual changes, and/or hearing changes  All other systems reviewed and are otherwise negative except as noted above.  Labs:  Recent Labs  08/09/14 2002 08/10/14 0320 08/10/14 0628  TROPONINI 0.45* 0.03 <0.03   Lab Results  Component Value Date   WBC 5.5 08/10/2014   HGB 10.8* 08/10/2014   HCT 32.4* 08/10/2014   MCV 84.6 08/10/2014   PLT 81* 08/10/2014    Recent Labs Lab 08/09/14 1207 08/10/14 0320  NA 137 139  K 3.3* 3.4*  CL 103 109  CO2 26 24  BUN 16 10  CREATININE 0.52* 0.41*  CALCIUM 7.7* 7.4*  PROT 4.9*  --   BILITOT 0.7  --   ALKPHOS 101  --   ALT 25  --   AST 17  --   GLUCOSE 54* 43*   No results found for: CHOL, HDL, LDLCALC, TRIG No results found for: DDIMER  Radiology/Studies:  Dg Chest 2 View  08/09/2014   CLINICAL DATA:  Non-Hodgkin's lymphoma with recent syncopal episode  EXAM: CHEST - 2 VIEW  COMPARISON:  05/24/2008.  FINDINGS: Cardiac shadow is within normal limits. A right chest wall port is noted in satisfactory position. Mild interstitial changes are noted likely of a chronic nature. Blunting of left costophrenic angle is again seen and stable.  IMPRESSION: Chronic changes without acute abnormality.    Electronically Signed   By: Inez Catalina M.D.   On: 08/09/2014 12:45   Ct Angio Chest Pe W/cm &/or Wo Cm  08/09/2014   CLINICAL DATA:  Dizziness  EXAM: CT ANGIOGRAPHY CHEST WITH CONTRAST  TECHNIQUE: Multidetector CT imaging of the chest was performed using the standard protocol during bolus administration of intravenous contrast. Multiplanar CT image reconstructions and MIPs were obtained to evaluate the vascular anatomy.  CONTRAST:  145mL OMNIPAQUE IOHEXOL 350 MG/ML SOLN  COMPARISON:  06/13/2014 and 04/28/2014  FINDINGS: There are no filling defects in the pulmonary arterial tree to suggest acute pulmonary thromboembolism.  Precarinal lymph node is 7 mm and has markedly reduced in  size. Additional precarinal lymph node on image 65 is 10 mm and L is also reduced in size. Sub carinal lymph node is 13 mm and was previously 23 mm based on my measurements. Soft tissue prominence of the right hilum has improved. Prevascular nodes are smaller.  Interstitial lung disease within the right upper lobe characterized primarily by interlobular septal thickening is again noted. It is not significantly changed. Background of emphysema is noted. Similar interstitial opacities in the peripheral right middle lobe are not significantly changed.  Interstitial changes and confluent consolidation in the left lower lobe has increased since the prior study.  A left pleural effusion has increased in size. It is somewhat loculated as it is lobulated and somewhat partition did.  Low-density areas in the spleen are present. There is an area of wedge-shaped low-density. On a previous PET-CT, the spleen was noted to be hypermetabolic. The low-density area in the spleen is inseparable from the hemidiaphragm. Invasion by tumor is not excluded.  No vertebral compression deformity.  No pneumothorax.  Review of the MIP images confirms the above findings.  IMPRESSION: No evidence of acute pulmonary thromboembolism.  Improve mediastinal adenopathy   Left pleural effusion has enlarged and now is loculated.  Pulmonary parenchymal disease in the left lower lobe has progressed. This may represent tumor infiltration. Similar findings in the right lung are stable.  Low-density area in the spleen is present worrisome for tumor. It is inseparable from the left hemidiaphragm and tumor invasion into the hemidiaphragm is not excluded.   Electronically Signed   By: Marybelle Killings M.D.   On: 08/09/2014 15:33    EKG: Atrial fibrillation with rapid ventricular rate  Weights: Filed Weights   08/09/14 1154 08/10/14 0500  Weight: 150 lb (68.04 kg) 151 lb 7.3 oz (68.7 kg)     Physical Exam: Blood pressure 90/49, pulse 79, temperature 97.6 F (36.4 C), temperature source Oral, resp. rate 17, height 5\' 10"  (1.778 m), weight 151 lb 7.3 oz (68.7 kg), SpO2 98 %. Body mass index is 21.73 kg/(m^2). General: Well developed, well nourished, in no acute distress. Head eyes ears nose throat: Normocephalic, atraumatic, sclera non-icteric, no xanthomas, nares are without discharge. No apparent thyromegaly and/or mass  Lungs: Normal respiratory effort.  no wheezes, no rales, few rhonchi.  Heart: RRR with normal S1 S2. no murmur gallop, no rub, PMI is normal size and placement, carotid upstroke normal without bruit, jugular venous pressure is normal Abdomen: Soft, non-tender, non-distended with normoactive bowel sounds. No hepatomegaly. No rebound/guarding. No obvious abdominal masses. Abdominal aorta is normal size without bruit Extremities: Trace edema. no cyanosis, no clubbing, no ulcers  Peripheral : 2+ bilateral upper extremity pulses, 2+ bilateral femoral pulses, 2+ bilateral dorsal pedal pulse Neuro: Alert and oriented. No facial asymmetry. No focal deficit. Moves all extremities spontaneously. Musculoskeletal: Normal muscle tone with  kyphosis Psych:  Responds to questions appropriately with a normal affect.    Assessment: 81 year old male with episode of  syncope weakness and fatigue waxing and waning possibly secondary to atrial fibrillation with rapid ventricular rate as well as hypotension doubt evidence of myocardial infarction or congestive heart failure  Plan: 1. Continue digoxin for heart rate control and maintenance of normal sinus rhythm and consider low-dose beta blocker as necessary for this X 2. Severe anticoagulation for further risk reduction in stroke with atrial fibrillation until maintaining normal rhythm 3. Echocardiogram LV systolic dysfunction valvular heart disease contributing to above 4. Hydration for hypotension 5. Further diagnostic testing  after above  Signed, Corey Skains M.D. [facility] 08/10/2014, 5:36 PM

## 2014-08-10 NOTE — Progress Notes (Signed)
[facility] [place] NAME: David Beck    MR#:  [id number]  DATE OF BIRTH:  1920-03-23  SUBJECTIVE:  No dizziness would like to go home in NSR now  REVIEW OF SYSTEMS:    Review of Systems  Constitutional: Negative for fever, chills and malaise/fatigue.  HENT: Negative for sore throat.   Eyes: Negative for blurred vision.  Respiratory: Negative for cough, hemoptysis, shortness of breath and wheezing.   Cardiovascular: Negative for chest pain, palpitations and leg swelling.  Gastrointestinal: Negative for nausea, vomiting, abdominal pain, diarrhea and blood in stool.  Genitourinary: Negative for dysuria.  Musculoskeletal: Negative for back pain.  Neurological: Negative for dizziness, tremors and headaches.  Endo/Heme/Allergies: Does not bruise/bleed easily.    Tolerating Diet:yes      DRUG ALLERGIES:  No Known Allergies  VITALS:  Blood pressure 97/54, pulse 59, temperature 97.5 F (36.4 C), temperature source Oral, resp. rate 16, height 5\' 10"  (1.778 m), weight 68.7 kg (151 lb 7.3 oz), SpO2 100 %.  PHYSICAL EXAMINATION:   Physical Exam  Constitutional: He is oriented to person, place, and time and well-developed, well-nourished, and in no distress. No distress.  HENT:  Head: Normocephalic.  Eyes: No scleral icterus.  Neck: Normal range of motion. Neck supple. No JVD present. No tracheal deviation present.  Cardiovascular: Normal rate, regular rhythm and normal heart sounds.  Exam reveals no gallop and no friction rub.   No murmur heard. Pulmonary/Chest: Effort normal and breath sounds normal. No respiratory distress. He has no wheezes. He has no rales. He exhibits no tenderness.  Decreased left base  Abdominal: Soft. Bowel sounds are normal. He exhibits no distension and no mass. There is no tenderness. There is no rebound and no guarding.  Musculoskeletal: Normal range of motion. He exhibits no edema.   Neurological: He is alert and oriented to person, place, and time.  Skin: Skin is warm. No rash noted. No erythema.  Psychiatric: Affect and judgment normal.      LABORATORY PANEL:   CBC  Recent Labs Lab 08/10/14 0320  WBC 5.5  HGB 10.8*  HCT 32.4*  PLT 81*   ------------------------------------------------------------------------------------------------------------------  Chemistries   Recent Labs Lab 08/09/14 1207 08/10/14 0320  NA 137 139  K 3.3* 3.4*  CL 103 109  CO2 26 24  GLUCOSE 54* 43*  BUN 16 10  CREATININE 0.52* 0.41*  CALCIUM 7.7* 7.4*  AST 17  --   ALT 25  --   ALKPHOS 101  --   BILITOT 0.7  --    ------------------------------------------------------------------------------------------------------------------  Cardiac Enzymes  Recent Labs Lab 08/09/14 2002 08/10/14 0320 08/10/14 0628  TROPONINI 0.45* 0.03 <0.03   ------------------------------------------------------------------------------------------------------------------  RADIOLOGY:  Dg Chest 2 View  08/09/2014   CLINICAL DATA:  Non-Hodgkin's lymphoma with recent syncopal episode  EXAM: CHEST - 2 VIEW  COMPARISON:  05/24/2008.  FINDINGS: Cardiac shadow is within normal limits. A right chest wall port is noted in satisfactory position. Mild interstitial changes are noted likely of a chronic nature. Blunting of left costophrenic angle is again seen and stable.  IMPRESSION: Chronic changes without acute abnormality.   Electronically Signed   By: Inez Catalina M.D.   On: 08/09/2014 12:45   Ct Angio Chest Pe W/cm &/or Wo Cm  08/09/2014   CLINICAL DATA:  Dizziness  EXAM: CT ANGIOGRAPHY CHEST WITH CONTRAST  TECHNIQUE: Multidetector CT imaging of the chest was performed using the standard protocol during bolus  administration of intravenous contrast. Multiplanar CT image reconstructions and MIPs were obtained to evaluate the vascular anatomy.  CONTRAST:  179mL OMNIPAQUE IOHEXOL 350 MG/ML SOLN   COMPARISON:  06/13/2014 and 04/28/2014  FINDINGS: There are no filling defects in the pulmonary arterial tree to suggest acute pulmonary thromboembolism.  Precarinal lymph node is 7 mm and has markedly reduced in size. Additional precarinal lymph node on image 65 is 10 mm and L is also reduced in size. Sub carinal lymph node is 13 mm and was previously 23 mm based on my measurements. Soft tissue prominence of the right hilum has improved. Prevascular nodes are smaller.  Interstitial lung disease within the right upper lobe characterized primarily by interlobular septal thickening is again noted. It is not significantly changed. Background of emphysema is noted. Similar interstitial opacities in the peripheral right middle lobe are not significantly changed.  Interstitial changes and confluent consolidation in the left lower lobe has increased since the prior study.  A left pleural effusion has increased in size. It is somewhat loculated as it is lobulated and somewhat partition did.  Low-density areas in the spleen are present. There is an area of wedge-shaped low-density. On a previous PET-CT, the spleen was noted to be hypermetabolic. The low-density area in the spleen is inseparable from the hemidiaphragm. Invasion by tumor is not excluded.  No vertebral compression deformity.  No pneumothorax.  Review of the MIP images confirms the above findings.  IMPRESSION: No evidence of acute pulmonary thromboembolism.  Improve mediastinal adenopathy  Left pleural effusion has enlarged and now is loculated.  Pulmonary parenchymal disease in the left lower lobe has progressed. This may represent tumor infiltration. Similar findings in the right lung are stable.  Low-density area in the spleen is present worrisome for tumor. It is inseparable from the left hemidiaphragm and tumor invasion into the hemidiaphragm is not excluded.   Electronically Signed   By: Marybelle Killings M.D.   On: 08/09/2014 15:33     ASSESSMENT AND PLAN:   David Beck is a 81 y.o. male with a known history of DM, lymphoma here with dizziness. Last chemo on Thursday. Patient has been feeling dizzy on standing found to have new onset a fluuter. * Atrial flutter with rapid ventricular rate, new onset - possible cause of his dizziness. Etiology likely from dehydration and PNA/loculated effusion. Start rocephin and AZITHRO for PNA  Check echocardiogram. Consult cardiology for further recommendations including anticoagulation.CE negative Patient converted to NSR after IV DILT once given.  * Dehydration with orthostasis Cont IVF  * Anemia of chronic disease Monitor  * lymphoma OP f/u with Dr. Grayland Ormond  * Left pleural effusion /loculated effusion: consulted Dr Mortimer Fries. He thinks related to PNA and treat PNA. Patient not c/o SOB or cough.   * DM2 SSI, ADA  * DVT prophylaxis Lovenox   PT consult  Ok to tx step down Ambulate today and monitor     Management plans discussed with the patient and he is in agreement.  CODE STATUS: FULL  TOTAL TIME TAKING CARE OF THIS PATIENT: 30 minutes.   Greater than 50% counseling and coordination of care  POSSIBLE D/C tomorrow , DEPENDING ON CLINICAL CONDITION.   Brisha Mccabe M.D on 08/10/2014 at 11:59 AM  Between 7am to 6pm - Pager - [phone] After 6pm go to [email or web address] - password [email or web address] [facility]  Office  [phone]  CC: Primary care physician; Dion Body, MD

## 2014-08-10 NOTE — Progress Notes (Signed)
PT Cancellation Note  Patient Details Name: QUANG THORPE MRN: [id number] DOB: 20-Sep-1920   Cancelled Treatment:    Reason Eval/Treat Not Completed: Patient not medically ready. Chart reviewed, RN consulted. Holding pt treatment at this time due to vitals/ uncontrolled intermittent tachycardia. BP taken twice 72/60, 74/57, each time HR jumping into 140's for 20+ seconds and 9+ PVCs. Holding until additional consultation c RN. Will attempt at later date/time.     Yuji Walth C 08/10/2014, 2:28 PM  2:31 PM  Etta Grandchild, PT, DPT [place] License # [id number]

## 2014-08-10 NOTE — Progress Notes (Signed)
Pt had BP 89/49, 88/44, 93/60, notified Dr. Marcille Blanco who will order a bolus of NS.

## 2014-08-10 NOTE — Progress Notes (Signed)
Pt is alert and oriented, no complaints of pain.  Pt converts between NSR and a-fib, rate sometimes ranges from 90-140 (sustains 90's) except on exertion.  Cardiology following, no concerned with HR at this time.  2L [place], pt can get sob on exertion, tolerating diet.  Voids in urinal, three bowel movements (2 incontinent events).  Pt had hypotensive episode this afternoon (bp 70's), which improved with 500 ml NS bolus.  Pt remains stepdown status.

## 2014-08-11 DIAGNOSIS — E43 Unspecified severe protein-calorie malnutrition: Secondary | ICD-10-CM | POA: Insufficient documentation

## 2014-08-11 LAB — BASIC METABOLIC PANEL
Anion gap: 4 — ABNORMAL LOW (ref 5–15)
BUN: 9 mg/dL (ref 6–20)
CALCIUM: 7.2 mg/dL — AB (ref 8.9–10.3)
CO2: 26 mmol/L (ref 22–32)
Chloride: 110 mmol/L (ref 101–111)
Creatinine, Ser: 0.5 mg/dL — ABNORMAL LOW (ref 0.61–1.24)
GFR calc Af Amer: >60 mL/min (ref >60)
Glucose, Bld: 122 mg/dL — ABNORMAL HIGH (ref 65–99)
Potassium: 3.7 mmol/L (ref 3.5–5.1)
Sodium: 140 mmol/L (ref 135–145)

## 2014-08-11 LAB — GLUCOSE, CAPILLARY
Glucose-Capillary: 107 mg/dL — ABNORMAL HIGH (ref 65–99)
Glucose-Capillary: 205 mg/dL — ABNORMAL HIGH (ref 65–99)
Glucose-Capillary: 189 mg/dL — ABNORMAL HIGH (ref 65–99)
Glucose-Capillary: 220 mg/dL — ABNORMAL HIGH (ref 65–99)

## 2014-08-11 MED ORDER — DEXTROSE 5 % IV SOLN
1.0000 g | INTRAVENOUS | Status: DC
  Administered 2014-08-12 – 2014-08-13 (×2): 1 g via INTRAVENOUS
  Filled 2014-08-11 (×3): qty 10

## 2014-08-11 MED ORDER — DILTIAZEM HCL ER COATED BEADS 120 MG PO CP24
120.0000 mg | ORAL_CAPSULE | Freq: Every day | ORAL | Status: DC
  Administered 2014-08-11 – 2014-08-12 (×2): 120 mg via ORAL
  Filled 2014-08-11 (×2): qty 1

## 2014-08-11 MED ORDER — AZITHROMYCIN 250 MG PO TABS
500.0000 mg | ORAL_TABLET | Freq: Every day | ORAL | Status: DC
  Administered 2014-08-12 – 2014-08-13 (×2): 500 mg via ORAL
  Filled 2014-08-11 (×2): qty 2

## 2014-08-11 MED ORDER — DIGOXIN 125 MCG PO TABS
0.1250 mg | ORAL_TABLET | Freq: Every day | ORAL | Status: DC
  Administered 2014-08-11 – 2014-08-13 (×3): 0.125 mg via ORAL
  Filled 2014-08-11 (×3): qty 1

## 2014-08-11 NOTE — Progress Notes (Signed)
5 L of oxygen. NSR. Takes meds ok. IVF going. Dentures are in and instructed after dinner to put in a cup. High fall risk. Wife at the bedside. Edema to bilat legs. FS are stable. Pt has not reported any pain. A & O. Pt has no further concerns at this time.

## 2014-08-11 NOTE — Progress Notes (Signed)
[facility] Encounter Note  Patient: David Beck / Admit Date: 08/09/2014 / Date of Encounter: 08/11/2014, 8:04 AM   Subjective: Patient says he feels much better with less shortness of breath and no evidence of significant weakness  Review of Systems: Positive for: Mild shortness of breath Negative for: Vision change, hearing change, syncope, dizziness, nausea, vomiting,diarrhea, bloody stool, stomach pain, cough, congestion, diaphoresis, urinary frequency, urinary pain,skin lesions, skin rashes Others previously listed  Objective: Telemetry: Normal sinus rhythm with short runs of ventricular tachycardia Physical Exam: Blood pressure 134/76, pulse 69, temperature 97.8 F (36.6 C), temperature source Oral, resp. rate 13, height 5\' 10"  (1.778 m), weight 155 lb 13.8 oz (70.7 kg), SpO2 91 %. Body mass index is 22.36 kg/(m^2). General: Well developed, well nourished, in no acute distress. Head: Normocephalic, atraumatic, sclera non-icteric, no xanthomas, nares are without discharge. Neck: No apparent masses Lungs: Normal respirations with few wheezes, no rhonchi, no rales , basilar crackles   Heart: Irregular rate and rhythm, normal S1 S2, no murmur, no rub, no gallop, PMI is normal size and placement, carotid upstroke normal without bruit, jugular venous pressure normal Abdomen: Soft, non-tender, non-distended with normoactive bowel sounds. No hepatosplenomegaly. Abdominal aorta is normal size without bruit Extremities: Trace edema, no clubbing, no cyanosis, no ulcers,  Peripheral: 2+ radial, 2+ femoral, 2+ dorsal pedal pulses Neuro: Alert and oriented. Moves all extremities spontaneously. Psych:  Responds to questions appropriately with a normal affect.   Intake/Output Summary (Last 24 hours) at 08/11/14 0804 Last data filed at 08/11/14 0600  Gross per 24 hour  Intake   1340 ml  Output   1175 ml  Net    165 ml    Inpatient Medications:  . antiseptic oral  rinse  7 mL Mouth Rinse BID  . azithromycin  500 mg Intravenous Q24H  . cefTRIAXone (ROCEPHIN) IVPB 1 gram/50 mL D5W  1 g Intravenous Q24H  . diltiazem  30 mg Oral 4 times per day  . docusate sodium  100 mg Oral BID  . enoxaparin (LOVENOX) injection  40 mg Subcutaneous Q24H  . feeding supplement (ENSURE ENLIVE)  237 mL Oral TID WC  . insulin aspart  0-9 Units Subcutaneous TID WC  . megestrol  400 mg Oral Daily   Infusions:  . sodium chloride 75 mL/hr at 08/11/14 0700    Labs:  Recent Labs  08/10/14 0320 08/11/14 0456  NA 139 140  K 3.4* 3.7  CL 109 110  CO2 24 26  GLUCOSE 43* 122*  BUN 10 9  CREATININE 0.41* 0.50*  CALCIUM 7.4* 7.2*    Recent Labs  08/09/14 1207  AST 17  ALT 25  ALKPHOS 101  BILITOT 0.7  PROT 4.9*  ALBUMIN 2.8*    Recent Labs  08/09/14 1207 08/10/14 0320  WBC 12.4* 5.5  NEUTROABS 11.0*  --   HGB 10.5* 10.8*  HCT 31.7* 32.4*  MCV 83.3 84.6  PLT 95* 81*    Recent Labs  08/09/14 2002 08/10/14 0320 08/10/14 0628  TROPONINI 0.45* 0.03 <0.03   Invalid input(s): POCBNP No results for input(s): HGBA1C in the last 72 hours.   Weights: Filed Weights   08/09/14 1154 08/10/14 0500 08/11/14 0500  Weight: 150 lb (68.04 kg) 151 lb 7.3 oz (68.7 kg) 155 lb 13.8 oz (70.7 kg)     Radiology/Studies:  Dg Chest 2 View  08/09/2014   CLINICAL DATA:  Non-Hodgkin's lymphoma with recent syncopal episode  EXAM: CHEST - 2  VIEW  COMPARISON:  05/24/2008.  FINDINGS: Cardiac shadow is within normal limits. A right chest wall port is noted in satisfactory position. Mild interstitial changes are noted likely of a chronic nature. Blunting of left costophrenic angle is again seen and stable.  IMPRESSION: Chronic changes without acute abnormality.   Electronically Signed   By: Inez Catalina M.D.   On: 08/09/2014 12:45   Ct Angio Chest Pe W/cm &/or Wo Cm  08/09/2014   CLINICAL DATA:  Dizziness  EXAM: CT ANGIOGRAPHY CHEST WITH CONTRAST  TECHNIQUE: Multidetector CT  imaging of the chest was performed using the standard protocol during bolus administration of intravenous contrast. Multiplanar CT image reconstructions and MIPs were obtained to evaluate the vascular anatomy.  CONTRAST:  154mL OMNIPAQUE IOHEXOL 350 MG/ML SOLN  COMPARISON:  06/13/2014 and 04/28/2014  FINDINGS: There are no filling defects in the pulmonary arterial tree to suggest acute pulmonary thromboembolism.  Precarinal lymph node is 7 mm and has markedly reduced in size. Additional precarinal lymph node on image 65 is 10 mm and L is also reduced in size. Sub carinal lymph node is 13 mm and was previously 23 mm based on my measurements. Soft tissue prominence of the right hilum has improved. Prevascular nodes are smaller.  Interstitial lung disease within the right upper lobe characterized primarily by interlobular septal thickening is again noted. It is not significantly changed. Background of emphysema is noted. Similar interstitial opacities in the peripheral right middle lobe are not significantly changed.  Interstitial changes and confluent consolidation in the left lower lobe has increased since the prior study.  A left pleural effusion has increased in size. It is somewhat loculated as it is lobulated and somewhat partition did.  Low-density areas in the spleen are present. There is an area of wedge-shaped low-density. On a previous PET-CT, the spleen was noted to be hypermetabolic. The low-density area in the spleen is inseparable from the hemidiaphragm. Invasion by tumor is not excluded.  No vertebral compression deformity.  No pneumothorax.  Review of the MIP images confirms the above findings.  IMPRESSION: No evidence of acute pulmonary thromboembolism.  Improve mediastinal adenopathy  Left pleural effusion has enlarged and now is loculated.  Pulmonary parenchymal disease in the left lower lobe has progressed. This may represent tumor infiltration. Similar findings in the right lung are stable.   Low-density area in the spleen is present worrisome for tumor. It is inseparable from the left hemidiaphragm and tumor invasion into the hemidiaphragm is not excluded.   Electronically Signed   By: Marybelle Killings M.D.   On: 08/09/2014 15:33     Assessment and Recommendation  81 y.o. male with known diabetes with complication and acute nonvalvular atrial fibrillation with rapid ventricular rate likely associated with possible infection and/or pneumonia also related to orthostatic hypotension. The patient does have a minimal elevation of troponin most consistent with demand ischemia rather than acute coronary syndrome. 1. Continue supportive care of orthostatic hypotension with fluid resuscitation 2. Treatment of possible infection and pneumonia which may be driving atrial fibrillation with rapid ventricular rate more controlled at this time on digoxin 3. Digoxin for heart rate control of atrial fibrillation 4. If patient remains in normal sinus rhythm would not add anticoagulation due to concerns of fall and bleeding risk with likely go to remain in normal rhythm once above is corrected 5. Echocardiogram for LV systolic dysfunction valvular heart disease contributing to above 6. Further treatment options after above Signed, Serafina Royals M.D. FACC

## 2014-08-11 NOTE — Progress Notes (Signed)
   08/11/14 1250  Clinical Encounter Type  Visited With Patient  Visit Type Initial  Consult/Referral To Chaplain  Spiritual Encounters  Spiritual Needs Emotional;Other (Comment)  Stress Factors  Patient Stress Factors Not reviewed  Chaplain rounded in the unit and offered a compassionate presence and emotional support. Assisted the patient with his remote and got some items needed as requested. Chaplain Dayleen Beske A. Dalphine Cowie Ext. [phone]

## 2014-08-11 NOTE — Progress Notes (Signed)
[facility] [place] NAME: David Beck    MR#:  [id number]  DATE OF BIRTH:  [birth year]  SUBJECTIVE:  Patient still with RVR at times he is on DIG and dlit PO  REVIEW OF SYSTEMS:    Review of Systems  Constitutional: Negative for fever, chills and malaise/fatigue.  HENT: Negative for sore throat.   Eyes: Negative for blurred vision.  Respiratory: Negative for cough, hemoptysis, shortness of breath and wheezing.   Cardiovascular: Positive for leg swelling. Negative for chest pain and palpitations.  Gastrointestinal: Negative for nausea, vomiting, abdominal pain, diarrhea and blood in stool.  Genitourinary: Negative for dysuria.  Musculoskeletal: Negative for back pain.  Neurological: Negative for dizziness, tremors and headaches.  Endo/Heme/Allergies: Does not bruise/bleed easily.    Tolerating Diet:yes      DRUG ALLERGIES:  No Known Allergies  VITALS:  Blood pressure 113/71, pulse 75, temperature 97.8 F (36.6 C), temperature source Oral, resp. rate 16, height 5\' 10"  (1.778 m), weight 70.7 kg (155 lb 13.8 oz), SpO2 93 %.  PHYSICAL EXAMINATION:   Physical Exam  Constitutional: He is oriented to person, place, and time and well-developed, well-nourished, and in no distress. No distress.  HENT:  Head: Normocephalic.  Eyes: No scleral icterus.  Neck: Normal range of motion. Neck supple. No JVD present. No tracheal deviation present.  Cardiovascular: Normal rate, regular rhythm and normal heart sounds.  Exam reveals no gallop and no friction rub.   No murmur heard. Pulmonary/Chest: Effort normal and breath sounds normal. No respiratory distress. He has no wheezes. He has no rales. He exhibits no tenderness.  Decreased left base  Abdominal: Soft. Bowel sounds are normal. He exhibits no distension and no mass. There is no tenderness. There is no rebound and no guarding.  Musculoskeletal: Normal range of motion. He exhibits  edema.  Neurological: He is alert and oriented to person, place, and time.  Skin: Skin is warm. No rash noted. No erythema.  Psychiatric: Affect and judgment normal.      LABORATORY PANEL:   CBC  Recent Labs Lab 08/10/14 0320  WBC 5.5  HGB 10.8*  HCT 32.4*  PLT 81*   ------------------------------------------------------------------------------------------------------------------  Chemistries   Recent Labs Lab 08/09/14 1207  08/11/14 0456  NA 137  < > 140  K 3.3*  < > 3.7  CL 103  < > 110  CO2 26  < > 26  GLUCOSE 54*  < > 122*  BUN 16  < > 9  CREATININE 0.52*  < > 0.50*  CALCIUM 7.7*  < > 7.2*  AST 17  --   --   ALT 25  --   --   ALKPHOS 101  --   --   BILITOT 0.7  --   --   < > = values in this interval not displayed. ------------------------------------------------------------------------------------------------------------------  Cardiac Enzymes  Recent Labs Lab 08/09/14 2002 08/10/14 0320 08/10/14 0628  TROPONINI 0.45* 0.03 <0.03   ------------------------------------------------------------------------------------------------------------------  RADIOLOGY:  Dg Chest 2 View  08/09/2014   CLINICAL DATA:  Non-Hodgkin's lymphoma with recent syncopal episode  EXAM: CHEST - 2 VIEW  COMPARISON:  05/24/2008.  FINDINGS: Cardiac shadow is within normal limits. A right chest wall port is noted in satisfactory position. Mild interstitial changes are noted likely of a chronic nature. Blunting of left costophrenic angle is again seen and stable.  IMPRESSION: Chronic changes without acute abnormality.   Electronically Signed   By: Elta Guadeloupe  Lukens M.D.   On: 08/09/2014 12:45   Ct Angio Chest Pe W/cm &/or Wo Cm  08/09/2014   CLINICAL DATA:  Dizziness  EXAM: CT ANGIOGRAPHY CHEST WITH CONTRAST  TECHNIQUE: Multidetector CT imaging of the chest was performed using the standard protocol during bolus administration of intravenous contrast. Multiplanar CT image reconstructions and  MIPs were obtained to evaluate the vascular anatomy.  CONTRAST:  119mL OMNIPAQUE IOHEXOL 350 MG/ML SOLN  COMPARISON:  06/13/2014 and 04/28/2014  FINDINGS: There are no filling defects in the pulmonary arterial tree to suggest acute pulmonary thromboembolism.  Precarinal lymph node is 7 mm and has markedly reduced in size. Additional precarinal lymph node on image 65 is 10 mm and L is also reduced in size. Sub carinal lymph node is 13 mm and was previously 23 mm based on my measurements. Soft tissue prominence of the right hilum has improved. Prevascular nodes are smaller.  Interstitial lung disease within the right upper lobe characterized primarily by interlobular septal thickening is again noted. It is not significantly changed. Background of emphysema is noted. Similar interstitial opacities in the peripheral right middle lobe are not significantly changed.  Interstitial changes and confluent consolidation in the left lower lobe has increased since the prior study.  A left pleural effusion has increased in size. It is somewhat loculated as it is lobulated and somewhat partition did.  Low-density areas in the spleen are present. There is an area of wedge-shaped low-density. On a previous PET-CT, the spleen was noted to be hypermetabolic. The low-density area in the spleen is inseparable from the hemidiaphragm. Invasion by tumor is not excluded.  No vertebral compression deformity.  No pneumothorax.  Review of the MIP images confirms the above findings.  IMPRESSION: No evidence of acute pulmonary thromboembolism.  Improve mediastinal adenopathy  Left pleural effusion has enlarged and now is loculated.  Pulmonary parenchymal disease in the left lower lobe has progressed. This may represent tumor infiltration. Similar findings in the right lung are stable.  Low-density area in the spleen is present worrisome for tumor. It is inseparable from the left hemidiaphragm and tumor invasion into the hemidiaphragm is not  excluded.   Electronically Signed   By: Marybelle Killings M.D.   On: 08/09/2014 15:33     ASSESSMENT AND PLAN:  David Beck is a 81 y.o. male with a known history of DM, lymphoma here with dizziness. Last chemo on Thursday. Patient has been feeling dizzy on standing found to have new onset a fluuter. * Atrial flutter with rapid ventricular rate, new onset - possible cause of his dizziness. Etiology likely from dehydration and PNA/loculated effusion. Start rocephin and AZITHRO for PNA Patient will continue on diltiazem 120 mg by mouth daily and digoxin. I appreciate cardiology consultation. 2-D echocardiogram shows a normal ejection fraction without valvular abnormalities. Patient is high fall risk and therefore not a candidate for anticoagulation.  * Dehydration with orthostasis Cont IVF  * Anemia of chronic disease Monitor  * lymphoma OP f/u with Dr. Grayland Ormond  * Left pleural effusion /loculated effusion: consulted Dr Mortimer Fries. He thinks related to PNA and treat PNA.   * DM2 SSI, ADA  * DVT prophylaxis Lovenox   PT consult   Transferred to telemetry today  management plans discussed with the patient and wife at bedside and he is in agreement.  CODE STATUS: FULL  TOTAL TIME TAKING CARE OF THIS PATIENT: 28 minutes.   Greater than 50% counseling and coordination of care  POSSIBLE D/C  tomorrow , DEPENDING ON CLINICAL CONDITION.   Montgomery Rothlisberger M.D on 08/11/2014 at 11:08 AM  Between 7am to 6pm - Pager - [phone] After 6pm go to [email or web address] - password [email or web address] [facility]  Office  [phone]  CC: Primary care physician; Dion Body, MD

## 2014-08-11 NOTE — Progress Notes (Signed)
Patient alert and oriented on 2 liters. Dr Nehemiah Massed and [facility] aware of patients heart rate at times 140's. Changed Cardizem to extended release, Dig ordered. PT will not see patient until heart rate stabilizes. Tolerating diet and using urinal.

## 2014-08-11 NOTE — Progress Notes (Signed)
Skin vertified by Rockie Neighbours RN

## 2014-08-11 NOTE — Care Management Important Message (Signed)
Important Message  Patient Details  Name: David Beck MRN: [id number] Date of Birth: 31-Aug-1920   Medicare Important Message Given:  Yes-second notification given    Katrina Stack, RN 08/11/2014, 3:56 PM

## 2014-08-11 NOTE — Progress Notes (Signed)
Visit made. Patient seen sitting up in bed, alert and interactive, continues on oxygen at 2 liters via nasal cannula. Per chart notes patient was unable to participate in PT today d/t decreased oxygen saturations and fluctuating heart rate. 2-D echocardiogram shows a normal ejection fraction.  He continues on IVF with some improvement in BP. He is to be transferred to telemetry when a bed  Is available. Son Ulice Dash present for visit. No concerns at this time. Vici notified that patient is followed by [facility]. Will need resumption of care orders at discharge. Thank you. Flo Shanks RN, BSN, [facility] Liaison [phone] c

## 2014-08-11 NOTE — Progress Notes (Signed)
Dr.Hower made aware blood sugar no further orders given

## 2014-08-11 NOTE — Progress Notes (Signed)
PT Cancellation Note  Patient Details Name: David Beck MRN: [id number] DOB: Jul 17, 1920   Cancelled Treatment:    Reason Eval/Treat Not Completed: Patient not medically ready.  Attempted to see pt for physical therapy evaluation (with pt's son present).  While pt was talking, pt's O2 sats (on supplemental O2) decreased to 86% (went back up to low 90's with vc's for breathing technique) and then decreased to 84% (increased back up to low 90's with vc's for breathing technique).  Pt's HR also fluctuating between 93 bpm to 146 bpm while pt was talking so PT session ended and nursing notified regarding pt's HR and O2 vitals.  Pt does not appear medically appropriate to participate in PT currently.  Will hold PT at this time and re-attempt PT eval at a later date/time as medically appropriate.   Raquel Sarna Boots Mc[place]own 08/11/2014, 1:54 PM Leitha Bleak, [place]

## 2014-08-11 NOTE — Care Management (Signed)
Spoke with Craige Cotta from [facility] and patient was open to service June 16 2014 to skilled nursing only.  Patient with diagnosis of B cell lymphoma Patient is scheduled to transfer out of ICU to 2A.  Patient has not been able to tolerate any activity- even conversation without heart rate going up to the 150's.  Physical therapy has not been able to treat due to this.  Will anticipate possible need for skilled nursing vs return home with resumption of care orders for home health nursing and addition of physical therapy

## 2014-08-12 ENCOUNTER — Inpatient Hospital Stay: Payer: Medicare Other

## 2014-08-12 LAB — GLUCOSE, CAPILLARY
GLUCOSE-CAPILLARY: 188 mg/dL — AB (ref 65–99)
GLUCOSE-CAPILLARY: 207 mg/dL — AB (ref 65–99)
GLUCOSE-CAPILLARY: 195 mg/dL — AB (ref 65–99)
Glucose-Capillary: 115 mg/dL — ABNORMAL HIGH (ref 65–99)

## 2014-08-12 MED ORDER — FLEET ENEMA 7-19 GM/118ML RE ENEM
1.0000 | ENEMA | Freq: Once | RECTAL | Status: AC
  Administered 2014-08-12: 1 via RECTAL

## 2014-08-12 MED ORDER — MAGNESIUM HYDROXIDE 400 MG/5ML PO SUSP
30.0000 mL | Freq: Every day | ORAL | Status: DC | PRN
  Administered 2014-08-12: 30 mL via ORAL

## 2014-08-12 MED ORDER — DILTIAZEM HCL ER COATED BEADS 180 MG PO CP24
180.0000 mg | ORAL_CAPSULE | Freq: Every day | ORAL | Status: DC
  Administered 2014-08-13: 180 mg via ORAL
  Filled 2014-08-12: qty 1

## 2014-08-12 MED ORDER — DIPHENHYDRAMINE HCL 25 MG PO CAPS
25.0000 mg | ORAL_CAPSULE | Freq: Every evening | ORAL | Status: DC | PRN
  Administered 2014-08-12: 25 mg via ORAL
  Filled 2014-08-12: qty 1

## 2014-08-12 NOTE — Progress Notes (Signed)
[place] [facility] [place] NAME: David Beck    MR#:  [id number]  DATE OF BIRTH:  07/07/1920  SUBJECTIVE:  Patient continues to go into atrial fibrillation and then back into sinus rhythm. His heart rate is elevated when he is up. Patient is requesting to be discharged home.  REVIEW OF SYSTEMS:    Review of Systems  Constitutional: Negative for fever, chills and malaise/fatigue.  HENT: Negative for ear discharge, ear pain and sore throat.   Eyes: Negative for blurred vision.  Respiratory: Negative for cough, hemoptysis, shortness of breath and wheezing.   Cardiovascular: Positive for leg swelling. Negative for chest pain and palpitations.  Gastrointestinal: Negative for nausea, vomiting, abdominal pain, diarrhea and blood in stool.  Genitourinary: Negative for dysuria.  Musculoskeletal: Negative for back pain.  Neurological: Negative for dizziness, tremors and headaches.  Endo/Heme/Allergies: Does not bruise/bleed easily.    Tolerating Diet:yes      DRUG ALLERGIES:  No Known Allergies  VITALS:  Blood pressure 108/58, pulse 76, temperature 97.6 F (36.4 C), temperature source Oral, resp. rate 20, height 5\' 10"  (1.778 m), weight 67.087 kg (147 lb 14.4 oz), SpO2 89 %.  PHYSICAL EXAMINATION:   Physical Exam  Constitutional: He is oriented to person, place, and time and well-developed, well-nourished, and in no distress. No distress.  HENT:  Head: Normocephalic.  Eyes: No scleral icterus.  Neck: Normal range of motion. Neck supple. No JVD present. No tracheal deviation present.  Cardiovascular: Normal heart sounds.  Exam reveals no gallop and no friction rub.   No murmur heard. irr.irr  Pulmonary/Chest: Effort normal and breath sounds normal. No respiratory distress. He has no wheezes. He has no rales. He exhibits no tenderness.  Decreased left base  Abdominal: Soft. Bowel sounds are normal. He exhibits no distension and no  mass. There is no tenderness. There is no rebound and no guarding.  Musculoskeletal: Normal range of motion. He exhibits edema.  Neurological: He is alert and oriented to person, place, and time.  Skin: Skin is warm. No rash noted. No erythema.  Psychiatric: Affect and judgment normal.      LABORATORY PANEL:   CBC  Recent Labs Lab 08/10/14 0320  WBC 5.5  HGB 10.8*  HCT 32.4*  PLT 81*   ------------------------------------------------------------------------------------------------------------------  Chemistries   Recent Labs Lab 08/09/14 1207  08/11/14 0456  NA 137  < > 140  K 3.3*  < > 3.7  CL 103  < > 110  CO2 26  < > 26  GLUCOSE 54*  < > 122*  BUN 16  < > 9  CREATININE 0.52*  < > 0.50*  CALCIUM 7.7*  < > 7.2*  AST 17  --   --   ALT 25  --   --   ALKPHOS 101  --   --   BILITOT 0.7  --   --   < > = values in this interval not displayed. ------------------------------------------------------------------------------------------------------------------  Cardiac Enzymes  Recent Labs Lab 08/09/14 2002 08/10/14 0320 08/10/14 0628  TROPONINI 0.45* 0.03 <0.03   ------------------------------------------------------------------------------------------------------------------  RADIOLOGY:  No results found.   ASSESSMENT AND PLAN:  David Beck is a 81 y.o. male with a known history of DM, lymphoma here with dizziness. Last chemo on Thursday. Patient has been feeling dizzy on standing found to have new onset a flutter/fib  * Atrial flutter/fibwith rapid ventricular rate, new onset - possible cause of his dizziness. Etiology likely from  dehydration and PNA/loculated effusion.  Patient continues to have elevated heart rates and ambulation. Diltiazem has been increased by cardiology to 180 mg daily. He will continue on digoxin. We'll continue to monitor his heart rate. Patient is not quite ready for discharge today. Patient should avoid anticoagulation due to fall  risk. 2-D echocardiogram shows a normal ejection fraction without valvular abnormalities.  * Dehydration with orthostasis improved stop IVF  * Anemia of chronic disease Stable hemoglobin  * lymphoma OP f/u with Dr. Grayland Ormond  * Left pleural effusion /loculated effusion: Patient was seen and evaluated by Dr Mortimer Fries. It is set that he has a loculated effusion due to pneumonia and Dr. Mortimer Fries recommended antibiotics. Patient will continue Rocephin and azithromycin for now. He does not appear to be in distress. I will repeat a chest x-ray to evaluate the effusion.   * DM2 SSI, ADA  * DVT prophylaxis Lovenox   PT consult placed.   management plans discussed with the patient and wife at bedside and he is in agreement.  CODE STATUS: FULL  TOTAL TIME TAKING CARE OF THIS PATIENT: 52minutes.  POSSIBLE D/C 1-2 days, DEPENDING ON CLINICAL CONDITION.   David Beck M.D on 08/12/2014 at 12:01 PM  Between 7am to 6pm - Pager - [phone] After 6pm go to [email or web address] - password [email or web address] [facility]  Office  [phone]  CC: Primary care physician; Dion Body, MD

## 2014-08-12 NOTE — Progress Notes (Signed)
Physical Therapy Evaluation Patient Details Name: KENDALL JUSTO MRN: [id number] DOB: 03-Jul-1920 Today's Date: 08/12/2014   History of Present Illness  Mina Carlisi is a 81 y.o. male with a known history of DM, lymphoma here with dizziness. Last chemo on Thursday. Patient has been feeling dizzy on standing. Fluids given in the emergency room hoping it would improve his tachycardia and dizziness. The patient continues to be dizzy. Heartrate is as high as 160 in atrial flutter. No cardiac history.  Clinical Impression  Pt performs bed mobility independently with use of bedrails. He ambulated 180 ft with a RW and CGA, with a continuous step-through gait. His static and dynamic standing balance are impaired, necessitating use of an AD. Pt's O2 sats varied from 84% to 92% on 4 liters of nasal O2, although pt required cuing to breathe through his nose, and the nasal canula was observed to be out of place on occasion. Pt's respiratory rate with ambulation and transfers was 32-40 breaths/min with a reported 5-7/10 SOB. Pt will benefit from skilled PT services to improve his mobility and reduce his risk of falling, to facilitate a safe return home.    Follow Up Recommendations Home health PT;Supervision for mobility/OOB    Equipment Recommendations       Recommendations for Other Services       Precautions / Restrictions Precautions Precautions: Fall Precaution Comments: Frequent urination with little warning contribues to fall risk when standing. Restrictions Weight Bearing Restrictions: No      Mobility  Bed Mobility Overal bed mobility: Modified Independent             General bed mobility comments: Pt able to roll, scoot, and m,ove between sitting and supine independently with use of both bedrails  Transfers Overall transfer level: Needs assistance Equipment used: Rolling walker (2 wheeled) (with supervision)             General transfer comment: Pt requires repeated  verbal and tactile cues for safe hand placement when standing up and sitting down  Ambulation/Gait Ambulation/Gait assistance: Min guard Ambulation Distance (Feet): 180 Feet Assistive device: Rolling walker (2 wheeled) Gait Pattern/deviations: Step-through pattern (Flexed posture despite verbal cues.)   Gait velocity interpretation: <1.8 ft/sec, indicative of risk for recurrent falls General Gait Details: Continuous gait pattern (Pt received 10 min gait training in addition to evaluation.)  Stairs            Wheelchair Mobility    Modified Rankin (Stroke Patients Only)       Balance Overall balance assessment: Needs assistance     Sitting balance - Comments: Narrow stance 15 sec; unable to attempt SLS                                     Pertinent Vitals/Pain Pain Assessment: No/denies pain    Home Living Family/patient expects to be discharged to:: Private residence   Available Help at Discharge: Family (wife) Type of Home: House Home Access: Stairs to enter Curator) Entrance Stairs-Rails: Right;Left (cannot reach both) Entrance Stairs-Number of Steps: 6 Home Layout: Two level;Able to live on main level with bedroom/bathroom   Additional Comments: Has RW. SPC, and bathroom grab bars at home    Prior Function           Comments: Pt reports he ambulates without AD on level surfaces and uses a SPC on unlevel surfaces. At times, he uses hi RW.  Pt reports ambulating without assistance in the home and in the community. Pt still drives and runs errands in the community.     Hand Dominance        Extremity/Trunk Assessment   Upper Extremity Assessment: Overall WFL for tasks assessed           Lower Extremity Assessment: Generalized weakness (grossly 4/5 LE stremgtj)         Communication   Communication: HOH (wears glasses)  Cognition   Behavior During Therapy: WFL for tasks assessed/performed Overall Cognitive Status: Within  Functional Limits for tasks assessed                      General Comments General comments (skin integrity, edema, etc.): multiple skin tears, 4 liters nasal O2    Exercises        Assessment/Plan    PT Assessment Patient needs continued PT services  PT Diagnosis Difficulty walking;Generalized weakness (balance impairment)   PT Problem List Decreased strength;Decreased range of motion;Decreased activity tolerance;Decreased balance;Decreased mobility;Decreased knowledge of use of DME;Decreased safety awareness;Cardiopulmonary status limiting activity;Decreased skin integrity  PT Treatment Interventions Gait training;Stair training;Functional mobility training;Therapeutic activities;Therapeutic exercise;Balance training;Neuromuscular re-education;Patient/family education;Manual techniques;Modalities   PT Goals (Current goals can be found in the Care Plan section) Acute Rehab PT Goals Patient Stated Goal: to go home PT Goal Formulation: With patient Time For Goal Achievement: 08/26/14 Potential to Achieve Goals: Good    Frequency Min 2X/week   Barriers to discharge   6 steps to enter home    Co-evaluation               End of Session       Nurse Communication: Mobility status (new skin tear on left shin observed at assessment)         Time: 1335-1416 PT Time Calculation (min) (ACUTE ONLY): 41 min   Charges:   PT Evaluation $Initial PT Evaluation Tier I: 1 Procedure PT Treatments $Gait Training: 8-22 mins   PT G Codes:       Violet Baldy, PT, [place] A Francesca Oman 08/12/2014, 2:44 PM

## 2014-08-12 NOTE — Progress Notes (Signed)
[facility] Encounter Note  Patient: David Beck / Admit Date: 08/09/2014 / Date of Encounter: 08/12/2014, 10:50 AM   Subjective: Patient feels well with no evidence of weakness or palpitations at this time although still is not doing activities  Review of Systems: Positive for: Weakness Negative for: Vision change, hearing change, syncope, dizziness, nausea, vomiting,diarrhea, bloody stool, stomach pain, cough, congestion, diaphoresis, urinary frequency, urinary pain,skin lesions, skin rashes Others previously listed  Objective: Telemetry: Normal sinus rhythm with pre-atrial contractions Physical Exam: Blood pressure 117/71, pulse 97, temperature 97.1 F (36.2 C), temperature source Oral, resp. rate 22, height 5\' 10"  (1.778 m), weight 147 lb 14.4 oz (67.087 kg), SpO2 95 %. Body mass index is 21.22 kg/(m^2). General: Well developed, well nourished, in no acute distress. Head: Normocephalic, atraumatic, sclera non-icteric, no xanthomas, nares are without discharge. Neck: No apparent masses Lungs: Normal respirations with no wheezes, no rhonchi, no rales , no crackles   Heart: Irregular rate and rhythm, normal S1 S2, 2+ mitral murmur, no rub, no gallop, PMI is normal size and placement, carotid upstroke normal without bruit, jugular venous pressure normal Abdomen: Soft, non-tender, non-distended with normoactive bowel sounds. No hepatosplenomegaly. Abdominal aorta is normal size without bruit Extremities: No edema, no clubbing, no cyanosis, no ulcers,  Peripheral: 2+ radial, 2+ femoral, 2+ dorsal pedal pulses Neuro: Alert and oriented. Moves all extremities spontaneously. Psych:  Responds to questions appropriately with a normal affect.   Intake/Output Summary (Last 24 hours) at 08/12/14 1050 Last data filed at 08/12/14 0915  Gross per 24 hour  Intake 531.25 ml  Output   1650 ml  Net -1118.75 ml    Inpatient Medications:  . antiseptic oral rinse  7 mL  Mouth Rinse BID  . azithromycin  500 mg Oral Daily  . cefTRIAXone (ROCEPHIN) IVPB 1 gram/50 mL D5W  1 g Intravenous Q24H  . digoxin  0.125 mg Oral Daily  . diltiazem  180 mg Oral Daily  . docusate sodium  100 mg Oral BID  . enoxaparin (LOVENOX) injection  40 mg Subcutaneous Q24H  . feeding supplement (ENSURE ENLIVE)  237 mL Oral TID WC  . insulin aspart  0-9 Units Subcutaneous TID WC  . megestrol  400 mg Oral Daily   Infusions:  . sodium chloride 75 mL/hr at 08/12/14 1021    Labs:  Recent Labs  08/10/14 0320 08/11/14 0456  NA 139 140  K 3.4* 3.7  CL 109 110  CO2 24 26  GLUCOSE 43* 122*  BUN 10 9  CREATININE 0.41* 0.50*  CALCIUM 7.4* 7.2*    Recent Labs  08/09/14 1207  AST 17  ALT 25  ALKPHOS 101  BILITOT 0.7  PROT 4.9*  ALBUMIN 2.8*    Recent Labs  08/09/14 1207 08/10/14 0320  WBC 12.4* 5.5  NEUTROABS 11.0*  --   HGB 10.5* 10.8*  HCT 31.7* 32.4*  MCV 83.3 84.6  PLT 95* 81*    Recent Labs  08/09/14 2002 08/10/14 0320 08/10/14 0628  TROPONINI 0.45* 0.03 <0.03   Invalid input(s): POCBNP No results for input(s): HGBA1C in the last 72 hours.   Weights: Filed Weights   08/10/14 0500 08/11/14 0500 08/12/14 0543  Weight: 151 lb 7.3 oz (68.7 kg) 155 lb 13.8 oz (70.7 kg) 147 lb 14.4 oz (67.087 kg)     Radiology/Studies:  Dg Chest 2 View  08/09/2014   CLINICAL DATA:  Non-Hodgkin's lymphoma with recent syncopal episode  EXAM: CHEST - 2 VIEW  COMPARISON:  05/24/2008.  FINDINGS: Cardiac shadow is within normal limits. A right chest wall port is noted in satisfactory position. Mild interstitial changes are noted likely of a chronic nature. Blunting of left costophrenic angle is again seen and stable.  IMPRESSION: Chronic changes without acute abnormality.   Electronically Signed   By: Inez Catalina M.D.   On: 08/09/2014 12:45   Ct Angio Chest Pe W/cm &/or Wo Cm  08/09/2014   CLINICAL DATA:  Dizziness  EXAM: CT ANGIOGRAPHY CHEST WITH CONTRAST  TECHNIQUE:  Multidetector CT imaging of the chest was performed using the standard protocol during bolus administration of intravenous contrast. Multiplanar CT image reconstructions and MIPs were obtained to evaluate the vascular anatomy.  CONTRAST:  124mL OMNIPAQUE IOHEXOL 350 MG/ML SOLN  COMPARISON:  06/13/2014 and 04/28/2014  FINDINGS: There are no filling defects in the pulmonary arterial tree to suggest acute pulmonary thromboembolism.  Precarinal lymph node is 7 mm and has markedly reduced in size. Additional precarinal lymph node on image 65 is 10 mm and L is also reduced in size. Sub carinal lymph node is 13 mm and was previously 23 mm based on my measurements. Soft tissue prominence of the right hilum has improved. Prevascular nodes are smaller.  Interstitial lung disease within the right upper lobe characterized primarily by interlobular septal thickening is again noted. It is not significantly changed. Background of emphysema is noted. Similar interstitial opacities in the peripheral right middle lobe are not significantly changed.  Interstitial changes and confluent consolidation in the left lower lobe has increased since the prior study.  A left pleural effusion has increased in size. It is somewhat loculated as it is lobulated and somewhat partition did.  Low-density areas in the spleen are present. There is an area of wedge-shaped low-density. On a previous PET-CT, the spleen was noted to be hypermetabolic. The low-density area in the spleen is inseparable from the hemidiaphragm. Invasion by tumor is not excluded.  No vertebral compression deformity.  No pneumothorax.  Review of the MIP images confirms the above findings.  IMPRESSION: No evidence of acute pulmonary thromboembolism.  Improve mediastinal adenopathy  Left pleural effusion has enlarged and now is loculated.  Pulmonary parenchymal disease in the left lower lobe has progressed. This may represent tumor infiltration. Similar findings in the right lung  are stable.  Low-density area in the spleen is present worrisome for tumor. It is inseparable from the left hemidiaphragm and tumor invasion into the hemidiaphragm is not excluded.   Electronically Signed   By: Marybelle Killings M.D.   On: 08/09/2014 15:33     Assessment and Recommendation  81 y.o. male with paroxysmal nonvalvular atrial fibrillation with rapid ventricular rate and elevated troponin of 0.45 consistent with demand ischemia without evidence of myocardial infarction 1. Increase diltiazem to 180 mg each day for better heart rate control watching closely for blood pressure concerns 2. Anticoagulation for further risk reduction in stroke with atrial fibrillation if patient will tolerate without evidence of significant concerns of bleeding complications 3. Vigorous ambulation to follow for medication management improvements 4. Further consideration of other adjustments after above  Signed, Serafina Royals M.D. FACC

## 2014-08-12 NOTE — Care Management Note (Addendum)
Case Management Note  Patient Details  Name: David Beck MRN: [id number] Date of Birth: 06-24-1920  Subjective/Objective:         Case Management will continue to follow for discharge planning. PT is recommending home with home health PT. David Beck has a rolling walker at home. Currently a client of [facility] for nursing. 81yo David Beck is in a Telemetry bed because he is in and out of new onset atrial fib upon exertion. IV ABX continue per pneumonia and left pleural effusion. Followed outpatient by Dr Grayland Ormond for lymphoma chemotherapy treatment. Anticipate discharge home with resumption of home health Nurse and with the addition of home health PT.             Action/Plan:   Expected Discharge Date:                  Expected Discharge Plan:     In-House Referral:     Discharge planning Services     Post Acute Care Choice:    Choice offered to:     DME Arranged:    DME Agency:     HH Arranged:    [place] Agency:     Status of Service:     Medicare Important Message Given:  Yes-second notification given Date Medicare IM Given:    Medicare IM give by:    Date Additional Medicare IM Given:    Additional Medicare Important Message give by:     If discussed at [place] of Stay Meetings, dates discussed:    Additional Comments:  Okema Rollinson A, RN 08/12/2014, 3:31 PM

## 2014-08-13 LAB — BASIC METABOLIC PANEL
ANION GAP: 7 (ref 5–15)
BUN: 7 mg/dL (ref 6–20)
CO2: 27 mmol/L (ref 22–32)
Calcium: 7.7 mg/dL — ABNORMAL LOW (ref 8.9–10.3)
Chloride: 106 mmol/L (ref 101–111)
Creatinine, Ser: 0.55 mg/dL — ABNORMAL LOW (ref 0.61–1.24)
GFR calc Af Amer: >60 mL/min (ref >60)
GFR calc non Af Amer: >60 mL/min (ref >60)
GLUCOSE: 122 mg/dL — AB (ref 65–99)
Potassium: 3.4 mmol/L — ABNORMAL LOW (ref 3.5–5.1)
Sodium: 140 mmol/L (ref 135–145)

## 2014-08-13 LAB — GLUCOSE, CAPILLARY
GLUCOSE-CAPILLARY: 142 mg/dL — AB (ref 65–99)
Glucose-Capillary: 112 mg/dL — ABNORMAL HIGH (ref 65–99)
Glucose-Capillary: 158 mg/dL — ABNORMAL HIGH (ref 65–99)

## 2014-08-13 MED ORDER — DIGOXIN 125 MCG PO TABS: 0.1250 mg | ORAL_TABLET | Freq: Every day | ORAL | Status: AC

## 2014-08-13 MED ORDER — DILTIAZEM HCL ER COATED BEADS 180 MG PO CP24: 180.0000 mg | ORAL_CAPSULE | Freq: Every day | ORAL | Status: AC

## 2014-08-13 MED ORDER — LEVOFLOXACIN 750 MG PO TABS: 750.0000 mg | ORAL_TABLET | Freq: Every day | ORAL | Status: DC

## 2014-08-13 MED ORDER — DOCUSATE SODIUM 100 MG PO CAPS: 100.0000 mg | ORAL_CAPSULE | Freq: Two times a day (BID) | ORAL | Status: AC

## 2014-08-13 MED ORDER — ENSURE ENLIVE PO LIQD: 237.0000 mL | Freq: Three times a day (TID) | ORAL | Status: AC

## 2014-08-13 NOTE — Discharge Summary (Signed)
[place] at [place] NAME: David Beck    MR#:  [id number]  DATE OF BIRTH:  17-Jan-1920  DATE OF ADMISSION:  08/09/2014 ADMITTING PHYSICIAN: Hillary Bow, MD  DATE OF DISCHARGE: 08/13/2014 PRIMARY CARE PHYSICIAN: Dion Body, MD    ADMISSION DIAGNOSIS:  Orthostatic hypotension [I95.1] Dehydration [E86.0]  DISCHARGE DIAGNOSIS:  Active Problems:   Dehydration   Atrial flutter   Protein-calorie malnutrition, severe   SECONDARY DIAGNOSIS:   Past Medical History  Diagnosis Date  . Diabetes mellitus without complication   . DJD (degenerative joint disease)   . BPH (benign prostatic hyperplasia)   . Hyperlipidemia   . History of hip replacement   . Pneumonia   . Melanoma   . Small cell b-cell lymphoma     HOSPITAL COURSE:  David Beck is a 81 y.o. male with a known history of DM, lymphoma here with dizziness. Last chemo on Thursday. Patient has been feeling dizzy on standing found to have new onset a flutter/fib  * Atrial flutter/fibwith rapid ventricular rate, new onset - possible cause of his dizziness. Etiology likely from dehydration and PNA/loculated effusion.  Patient's HRs improved on DILT 180 mg and digoxin. Patient should avoid anticoagulation due to fall risk. 2-D echocardiogram shows a normal ejection fraction without valvular abnormalities.  * Dehydration with orthostasis improved stop IVF  * Anemia of chronic disease Stable hemoglobin  * lymphoma He will need outpatient  f/u with Dr. Grayland Ormond  * Left pleural effusion /loculated effusion: Patient was seen and evaluated by Dr Mortimer Fries. It is set that he has a loculated effusion due to pneumonia and Dr. Mortimer Fries recommended antibiotics. Patient was on Rocephin and azithromycin for now. He does not appear to be in distress. I spoke with Pulmonary on call and they recommend that the patient have follow up for this effusion next week. He will require O2 at  d/c     DISCHARGE CONDITIONS AND DIET:  Home in stable condition with [facility] Regular diet  CONSULTS OBTAINED:  Treatment Team:  Flora Lipps, MD Corey Skains, MD Laverle Hobby, MD  DRUG ALLERGIES:  No Known Allergies  DISCHARGE MEDICATIONS:   Current Discharge Medication List    START taking these medications   Details  digoxin (LANOXIN) 0.125 MG tablet Take 1 tablet (0.125 mg total) by mouth daily. Qty: 30 tablet, Refills: 0    diltiazem (CARDIZEM CD) 180 MG 24 hr capsule Take 1 capsule (180 mg total) by mouth daily. Qty: 30 capsule, Refills: 0    docusate sodium (COLACE) 100 MG capsule Take 1 capsule (100 mg total) by mouth 2 (two) times daily. Qty: 10 capsule, Refills: 0    feeding supplement, ENSURE ENLIVE, (ENSURE ENLIVE) LIQD Take 237 mLs by mouth 3 (three) times daily with meals. Qty: 237 mL, Refills: 12    levofloxacin (LEVAQUIN) 750 MG tablet Take 1 tablet (750 mg total) by mouth daily. Qty: 7 tablet, Refills: 0      CONTINUE these medications which have NOT CHANGED   Details  Alum & Mag Hydroxide-Simeth (MAGIC MOUTHWASH) SOLN Take 5 mLs by mouth 4 (four) times daily as needed for mouth pain. Qty: 480 mL, Refills: 0    glipiZIDE (GLUCOTROL) 10 MG tablet Take 5 mg by mouth 2 (two) times daily.    lidocaine-prilocaine (EMLA) cream Apply 1 application topically as needed. Apply to port area 1-2 hours prior to chemotherapy treatment appointment, cover with plastic wrap. Qty: 30 g, Refills:  1   Associated Diagnoses: DLBCL (diffuse large B cell lymphoma)    megestrol (MEGACE) 40 MG/ML suspension Take 10 mLs (400 mg total) by mouth daily. Qty: 240 mL, Refills: 2   Associated Diagnoses: DLBCL (diffuse large B cell lymphoma)    Oxycodone HCl 10 MG TABS Take 1 tablet (10 mg total) by mouth every 6 (six) hours as needed. Qty: 90 tablet, Refills: 0   Associated Diagnoses: DLBCL (diffuse large B cell lymphoma)    predniSONE (DELTASONE) 50 MG tablet Take 1  tablet daily for 5 days with each chemotherapy treatment Qty: 30 tablet, Refills: 2    prochlorperazine (COMPAZINE) 10 MG tablet Take 1 tablet (10 mg total) by mouth every 6 (six) hours as needed for nausea or vomiting. Qty: 90 tablet, Refills: 2      STOP taking these medications     HYDROcodone-acetaminophen (NORCO/VICODIN) 5-325 MG per tablet               Today   CHIEF COMPLAINT:  i want to go home i can go home with O2   VITAL SIGNS:  Blood pressure 101/63, pulse 76, temperature 98 F (36.7 C), temperature source Oral, resp. rate 18, height 5\' 10"  (1.778 m), weight 64.774 kg (142 lb 12.8 oz), SpO2 96 %.   REVIEW OF SYSTEMS:  Review of Systems  Constitutional: Negative for fever, chills and malaise/fatigue.  HENT: Negative for sore throat.   Eyes: Negative for blurred vision.  Respiratory: Negative for cough, hemoptysis, shortness of breath and wheezing.   Cardiovascular: Positive for leg swelling. Negative for chest pain and palpitations.  Gastrointestinal: Negative for nausea, vomiting, abdominal pain, diarrhea and blood in stool.  Genitourinary: Negative for dysuria.  Musculoskeletal: Negative for back pain.  Neurological: Negative for dizziness, tremors and headaches.  Endo/Heme/Allergies: Does not bruise/bleed easily.     PHYSICAL EXAMINATION:  GENERAL:  81 y.o.-year-old patient lying in the bed with no acute distress.  NECK:  Supple, no jugular venous distention. No thyroid enlargement, no tenderness.  LUNGS: Normal breath sounds bilaterally, no wheezing, rales,rhonchi  No use of accessory muscles of respiration. Decreased left base CARDIOVASCULAR: irr,irr +murmur NO RUBbs, or gallops.  ABDOMEN: Soft, non-tender, non-distended. Bowel sounds present. No organomegaly or mass.  EXTREMITIES: No pedal edema, cyanosis, or clubbing.  PSYCHIATRIC: The patient is alert and oriented x 3.  SKIN: No obvious rash, lesion, or ulcer.   DATA REVIEW:    CBC  Recent Labs Lab 08/10/14 0320  WBC 5.5  HGB 10.8*  HCT 32.4*  PLT 81*    Chemistries   Recent Labs Lab 08/09/14 1207  08/13/14 0441  NA 137  < > 140  K 3.3*  < > 3.4*  CL 103  < > 106  CO2 26  < > 27  GLUCOSE 54*  < > 122*  BUN 16  < > 7  CREATININE 0.52*  < > 0.55*  CALCIUM 7.7*  < > 7.7*  AST 17  --   --   ALT 25  --   --   ALKPHOS 101  --   --   BILITOT 0.7  --   --   < > = values in this interval not displayed.  Cardiac Enzymes  Recent Labs Lab 08/09/14 2002 08/10/14 0320 08/10/14 0628  TROPONINI 0.45* 0.03 <0.03    Microbiology Results  @[id number] @  RADIOLOGY:  Dg Chest 1 View  08/12/2014   CLINICAL DATA:  81 year old male with a history of new  onset shortness of breath. History of non-Hodgkin's lymphoma  EXAM: CHEST  1 VIEW  COMPARISON:  Plain film 08/09/2014, CT 08/09/2014  FINDINGS: Cardiomediastinal silhouette unchanged in size and contour. Borders are somewhat obscured by overlying lung/ pleural disease.  Lung volumes are decreased from the prior with developing airspace disease at the bilateral lung bases. Interval opacification of the left hemidiaphragm and left costophrenic angle.  Unchanged appearance of port catheter on the right chest wall from right IJ approach. The tip of the catheter appears to terminate at the superior cavoatrial junction.  Atherosclerosis.  IMPRESSION: Interval development of left-sided pleural effusion, with worsening airspace disease at the lung bases, potentially edema or consolidation.  Unchanged right IJ port catheter.  Signed,  Dulcy Fanny. Earleen Newport, DO  [facility]  [facility]   Electronically Signed   By: Corrie Mckusick D.O.   On: 08/12/2014 12:00      Management plans discussed with the patient and he is in agreement. Stable for discharge home with [facility]  Patient should follow up with Pulmonary 3 days and PCP in 1 week  CODE STATUS:     Code Status Orders         Start     Ordered   08/09/14 1756  Full code   Continuous     08/09/14 1757    Advance Directive Documentation        Most Recent Value   Type of Advance Directive  Healthcare Power of Attorney   Pre-existing out of facility DNR order (yellow form or pink MOST form)     "MOST" Form in Place?        TOTAL TIME TAKING CARE OF THIS PATIENT: 35 minutes.    David Beck M.D on 08/13/2014 at 11:25 AM  Between 7am to 6pm - Pager - [phone] After 6pm go to [email or web address] - password [email or web address] [place] [facility] [facility]  Office  [phone]  CC: Primary care physician; Dion Body, MD

## 2014-08-13 NOTE — Progress Notes (Signed)
[facility] Encounter Note  Patient: David Beck / Admit Date: 08/09/2014 / Date of Encounter: 08/13/2014, 5:12 PM   Subjective: Patient feels well with no evidence of weakness or palpitations at this time    Review of Systems: Positive for: None Negative for: Vision change, hearing change, syncope, dizziness, nausea, vomiting,diarrhea, bloody stool, stomach pain, cough, congestion, diaphoresis, urinary frequency, urinary pain,skin lesions, skin rashes Others previously listed  Objective: Telemetry: Normal sinus rhythm with pre-atrial contractions Physical Exam: Blood pressure 101/63, pulse 76, temperature 98 F (36.7 C), temperature source Oral, resp. rate 18, height 5\' 10"  (1.778 m), weight 142 lb 12.8 oz (64.774 kg), SpO2 96 %. Body mass index is 20.49 kg/(m^2). General: Well developed, well nourished, in no acute distress. Head: Normocephalic, atraumatic, sclera non-icteric, no xanthomas, nares are without discharge. Neck: No apparent masses Lungs: Normal respirations with no wheezes, no rhonchi, no rales , no crackles   Heart: Irregular rate and rhythm, normal S1 S2, 2+ mitral murmur, no rub, no gallop, PMI is normal size and placement, carotid upstroke normal without bruit, jugular venous pressure normal Abdomen: Soft, non-tender, non-distended with normoactive bowel sounds. No hepatosplenomegaly. Abdominal aorta is normal size without bruit Extremities: No edema, no clubbing, no cyanosis, no ulcers,  Peripheral: 2+ radial, 2+ femoral, 2+ dorsal pedal pulses Neuro: Alert and oriented. Moves all extremities spontaneously. Psych:  Responds to questions appropriately with a normal affect.   Intake/Output Summary (Last 24 hours) at 08/13/14 1712 Last data filed at 08/13/14 1204  Gross per 24 hour  Intake    360 ml  Output    750 ml  Net   -390 ml    Inpatient Medications:  . antiseptic oral rinse  7 mL Mouth Rinse BID  . azithromycin  500 mg Oral  Daily  . cefTRIAXone (ROCEPHIN) IVPB 1 gram/50 mL D5W  1 g Intravenous Q24H  . digoxin  0.125 mg Oral Daily  . diltiazem  180 mg Oral Daily  . docusate sodium  100 mg Oral BID  . enoxaparin (LOVENOX) injection  40 mg Subcutaneous Q24H  . feeding supplement (ENSURE ENLIVE)  237 mL Oral TID WC  . insulin aspart  0-9 Units Subcutaneous TID WC  . megestrol  400 mg Oral Daily   Infusions:     Labs:  Recent Labs  08/11/14 0456 08/13/14 0441  NA 140 140  K 3.7 3.4*  CL 110 106  CO2 26 27  GLUCOSE 122* 122*  BUN 9 7  CREATININE 0.50* 0.55*  CALCIUM 7.2* 7.7*   No results for input(s): AST, ALT, ALKPHOS, BILITOT, PROT, ALBUMIN in the last 72 hours. No results for input(s): WBC, NEUTROABS, HGB, HCT, MCV, PLT in the last 72 hours. No results for input(s): CKTOTAL, CKMB, TROPONINI in the last 72 hours. Invalid input(s): POCBNP No results for input(s): HGBA1C in the last 72 hours.   Weights: Filed Weights   08/11/14 0500 08/12/14 0543 08/13/14 0427  Weight: 155 lb 13.8 oz (70.7 kg) 147 lb 14.4 oz (67.087 kg) 142 lb 12.8 oz (64.774 kg)     Radiology/Studies:  Dg Chest 1 View  08/12/2014   CLINICAL DATA:  81 year old male with a history of new onset shortness of breath. History of non-Hodgkin's lymphoma  EXAM: CHEST  1 VIEW  COMPARISON:  Plain film 08/09/2014, CT 08/09/2014  FINDINGS: Cardiomediastinal silhouette unchanged in size and contour. Borders are somewhat obscured by overlying lung/ pleural disease.  Lung volumes are decreased from the prior  with developing airspace disease at the bilateral lung bases. Interval opacification of the left hemidiaphragm and left costophrenic angle.  Unchanged appearance of port catheter on the right chest wall from right IJ approach. The tip of the catheter appears to terminate at the superior cavoatrial junction.  Atherosclerosis.  IMPRESSION: Interval development of left-sided pleural effusion, with worsening airspace disease at the lung bases,  potentially edema or consolidation.  Unchanged right IJ port catheter.  Signed,  Dulcy Fanny. Earleen Newport, DO  [facility]  [facility]   Electronically Signed   By: Corrie Mckusick D.O.   On: 08/12/2014 12:00   Dg Chest 2 View  08/09/2014   CLINICAL DATA:  Non-Hodgkin's lymphoma with recent syncopal episode  EXAM: CHEST - 2 VIEW  COMPARISON:  05/24/2008.  FINDINGS: Cardiac shadow is within normal limits. A right chest wall port is noted in satisfactory position. Mild interstitial changes are noted likely of a chronic nature. Blunting of left costophrenic angle is again seen and stable.  IMPRESSION: Chronic changes without acute abnormality.   Electronically Signed   By: Inez Catalina M.D.   On: 08/09/2014 12:45   Ct Angio Chest Pe W/cm &/or Wo Cm  08/09/2014   CLINICAL DATA:  Dizziness  EXAM: CT ANGIOGRAPHY CHEST WITH CONTRAST  TECHNIQUE: Multidetector CT imaging of the chest was performed using the standard protocol during bolus administration of intravenous contrast. Multiplanar CT image reconstructions and MIPs were obtained to evaluate the vascular anatomy.  CONTRAST:  158mL OMNIPAQUE IOHEXOL 350 MG/ML SOLN  COMPARISON:  06/13/2014 and 04/28/2014  FINDINGS: There are no filling defects in the pulmonary arterial tree to suggest acute pulmonary thromboembolism.  Precarinal lymph node is 7 mm and has markedly reduced in size. Additional precarinal lymph node on image 65 is 10 mm and L is also reduced in size. Sub carinal lymph node is 13 mm and was previously 23 mm based on my measurements. Soft tissue prominence of the right hilum has improved. Prevascular nodes are smaller.  Interstitial lung disease within the right upper lobe characterized primarily by interlobular septal thickening is again noted. It is not significantly changed. Background of emphysema is noted. Similar interstitial opacities in the peripheral right middle lobe are not significantly changed.   Interstitial changes and confluent consolidation in the left lower lobe has increased since the prior study.  A left pleural effusion has increased in size. It is somewhat loculated as it is lobulated and somewhat partition did.  Low-density areas in the spleen are present. There is an area of wedge-shaped low-density. On a previous PET-CT, the spleen was noted to be hypermetabolic. The low-density area in the spleen is inseparable from the hemidiaphragm. Invasion by tumor is not excluded.  No vertebral compression deformity.  No pneumothorax.  Review of the MIP images confirms the above findings.  IMPRESSION: No evidence of acute pulmonary thromboembolism.  Improve mediastinal adenopathy  Left pleural effusion has enlarged and now is loculated.  Pulmonary parenchymal disease in the left lower lobe has progressed. This may represent tumor infiltration. Similar findings in the right lung are stable.  Low-density area in the spleen is present worrisome for tumor. It is inseparable from the left hemidiaphragm and tumor invasion into the hemidiaphragm is not excluded.   Electronically Signed   By: Marybelle Killings M.D.   On: 08/09/2014 15:33     Assessment and Recommendation  81 y.o. male with paroxysmal nonvalvular atrial fibrillation with rapid ventricular rate and elevated troponin  of 0.45 consistent with demand ischemia without evidence of myocardial infarction 1. Increased diltiazem to 180 mg each day for better heart rate control watching closely for blood pressure concerns 2. Anticoagulation for further risk reduction in stroke with atrial fibrillation if patient will tolerate without evidence of significant concerns of bleeding complications 3. Vigorous ambulation to follow for medication management improvements 4. Further consideration of other adjustments after above  Signed, Serafina Royals M.D. FACC

## 2014-08-13 NOTE — Care Management Note (Signed)
Case Management Note  Patient Details  Name: David Beck MRN: [id number] Date of Birth: Oct 11, 1920  Subjective/Objective:      Updated [facility] on call nurse Mariann Laster that [name] is being discharged home and will need RN, PT, Aid and with new oxygen. Spoke on the phone with [name] wife and daughter (here from out of town) about the new oxygen and additional home health services through [facility].  Will order home oxygen from [facility] as soon as [name] nurse enters oxygen trials into the computer.               Action/Plan:   Expected Discharge Date:                  Expected Discharge Plan:     In-House Referral:     Discharge planning Services     Post Acute Care Choice:    Choice offered to:     DME Arranged:    DME Agency:     HH Arranged:    [place] Agency:     Status of Service:     Medicare Important Message Given:  Yes-second notification given Date Medicare IM Given:    Medicare IM give by:    Date Additional Medicare IM Given:    Additional Medicare Important Message give by:     If discussed at [employer] of Stay Meetings, dates discussed:    Additional Comments:  Thandiwe Siragusa A, RN 08/13/2014, 9:44 AM

## 2014-08-13 NOTE — Progress Notes (Signed)
[place] at [place] NAME: David Beck    MR#:  [id number]  DATE OF BIRTH:  06/18/1920  SUBJECTIVE:  Patient is requesting to be discharged home. I feel fine. NO CP, SOB or n/v REVIEW OF SYSTEMS:    Review of Systems  Constitutional: Negative for fever, chills and malaise/fatigue.  HENT: Negative for ear discharge, ear pain and sore throat.   Eyes: Negative for blurred vision.  Respiratory: Negative for cough, hemoptysis, shortness of breath and wheezing.   Cardiovascular: Positive for leg swelling. Negative for chest pain and palpitations.  Gastrointestinal: Negative for nausea, vomiting, abdominal pain, diarrhea and blood in stool.  Genitourinary: Negative for dysuria.  Musculoskeletal: Negative for back pain.  Neurological: Negative for dizziness, tremors and headaches.  Endo/Heme/Allergies: Does not bruise/bleed easily.    Tolerating Diet:yes      DRUG ALLERGIES:  No Known Allergies  VITALS:  Blood pressure 101/63, pulse 76, temperature 98 F (36.7 C), temperature source Oral, resp. rate 18, height 5\' 10"  (1.778 m), weight 64.774 kg (142 lb 12.8 oz), SpO2 96 %.  PHYSICAL EXAMINATION:   Physical Exam  Constitutional: He is oriented to person, place, and time and well-developed, well-nourished, and in no distress. No distress.  HENT:  Head: Normocephalic.  Eyes: No scleral icterus.  Neck: Normal range of motion. Neck supple. No JVD present. No tracheal deviation present.  Cardiovascular: Normal heart sounds.  Exam reveals no gallop and no friction rub.   No murmur heard. irr.irr  Pulmonary/Chest: Effort normal and breath sounds normal. No respiratory distress. He has no wheezes. He has no rales. He exhibits no tenderness.  Decreased left base  Abdominal: Soft. Bowel sounds are normal. He exhibits no distension and no mass. There is no tenderness. There is no rebound and no guarding.  Musculoskeletal: Normal  range of motion. He exhibits edema.  Neurological: He is alert and oriented to person, place, and time.  Skin: Skin is warm. No rash noted. No erythema.  Psychiatric: Affect and judgment normal.      LABORATORY PANEL:   CBC  Recent Labs Lab 08/10/14 0320  WBC 5.5  HGB 10.8*  HCT 32.4*  PLT 81*   ------------------------------------------------------------------------------------------------------------------  Chemistries   Recent Labs Lab 08/09/14 1207  08/13/14 0441  NA 137  < > 140  K 3.3*  < > 3.4*  CL 103  < > 106  CO2 26  < > 27  GLUCOSE 54*  < > 122*  BUN 16  < > 7  CREATININE 0.52*  < > 0.55*  CALCIUM 7.7*  < > 7.7*  AST 17  --   --   ALT 25  --   --   ALKPHOS 101  --   --   BILITOT 0.7  --   --   < > = values in this interval not displayed. ------------------------------------------------------------------------------------------------------------------  Cardiac Enzymes  Recent Labs Lab 08/09/14 2002 08/10/14 0320 08/10/14 0628  TROPONINI 0.45* 0.03 <0.03   ------------------------------------------------------------------------------------------------------------------  RADIOLOGY:  Dg Chest 1 View  08/12/2014   CLINICAL DATA:  81 year old male with a history of new onset shortness of breath. History of non-Hodgkin's lymphoma  EXAM: CHEST  1 VIEW  COMPARISON:  Plain film 08/09/2014, CT 08/09/2014  FINDINGS: Cardiomediastinal silhouette unchanged in size and contour. Borders are somewhat obscured by overlying lung/ pleural disease.  Lung volumes are decreased from the prior with developing airspace disease at the bilateral lung bases. Interval opacification  of the left hemidiaphragm and left costophrenic angle.  Unchanged appearance of port catheter on the right chest wall from right IJ approach. The tip of the catheter appears to terminate at the superior cavoatrial junction.  Atherosclerosis.  IMPRESSION: Interval development of left-sided pleural  effusion, with worsening airspace disease at the lung bases, potentially edema or consolidation.  Unchanged right IJ port catheter.  Signed,  Dulcy Fanny. Earleen Newport, DO  [facility]  [facility]   Electronically Signed   By: Corrie Mckusick D.O.   On: 08/12/2014 12:00     ASSESSMENT AND PLAN:  David Beck is a 81 y.o. male with a known history of DM, lymphoma here with dizziness. Last chemo on Thursday. Patient has been feeling dizzy on standing found to have new onset a flutter/fib  * Atrial flutter/fibwith rapid ventricular rate, new onset - possible cause of his dizziness. Etiology likely from dehydration and PNA/loculated effusion.  Patient's HRs improved on DILT 180 mg and digoxin. Patient should avoid anticoagulation due to fall risk. 2-D echocardiogram shows a normal ejection fraction without valvular abnormalities.  * Dehydration with orthostasis improved stop IVF  * Anemia of chronic disease Stable hemoglobin  * lymphoma He will need outpatient  f/u with Dr. Grayland Ormond  * Left pleural effusion /loculated effusion: Patient was seen and evaluated by Dr Mortimer Fries. It is set that he has a loculated effusion due to pneumonia and Dr. Mortimer Fries recommended antibiotics. Patient was on Rocephin and azithromycin for now. He does not appear to be in distress. I spoke with Pulmonary on call and they recommend that the patient have follow up for this effusion next week. He will require O2 at d/c   * DM2 SSI, ADA  * DVT prophylaxis Lovenox   PT consult placed.   management plans discussed with the patient  he is in agreement.  CODE STATUS: FULL  TOTAL TIME TAKING CARE OF THIS PATIENT: 25 minutes.   D/C today   David Beck M.D on 08/13/2014 at 11:22 AM  Between 7am to 6pm - Pager - [phone] After 6pm go to [email or web address] - password [email or web address] [place]  Office  [phone]  CC: Primary care physician; Dion Body,  MD

## 2014-08-13 NOTE — Progress Notes (Signed)
[place]  Telephone:(336) [phone] Fax:(336) [phone]  ID: David Beck OB: 1920-07-04  MR#: [id number]  FXJ#:[id number]  Patient Care Team: Dion Body, MD as PCP - General (Family Medicine) Algernon Huxley, MD as Consulting Physician (Vascular Surgery)  CHIEF COMPLAINT:  Chief Complaint  Patient presents with  . Follow-up    lymphoma    INTERVAL HISTORY: Patient returns to clinic today for further evaluation and consideration of cycle 3 of Rituxan plus CVP.  He continues to feel weak and fatigued, but otherwise feels well. He continues to have a fair appetite and his weight is stable. His pain is improved on his current narcotic regimen. He has no neurologic complaints. He denies any recent fevers. He denies any dysphasia. He denies any chest pain, cough, or shortness of breath. He denies any nausea, vomiting, or constipation. He has no urinary complaints. Patient offers no further specific complaints today.  REVIEW OF SYSTEMS:   Review of Systems  Constitutional: Positive for malaise/fatigue. Negative for fever.  Respiratory: Negative.   Cardiovascular: Negative.   Neurological: Positive for weakness.    As per HPI. Otherwise, a complete review of systems is negatve.  PAST MEDICAL HISTORY: Past Medical History  Diagnosis Date  . Diabetes mellitus without complication   . DJD (degenerative joint disease)   . BPH (benign prostatic hyperplasia)   . Hyperlipidemia   . History of hip replacement   . Pneumonia   . Melanoma   . Small cell b-cell lymphoma     PAST SURGICAL HISTORY: Past Surgical History  Procedure Laterality Date  . No past surgeries    . Abdominal aortic aneurysm repair      4 years ago  . Joint replacement    . Peripheral vascular catheterization N/A 06/08/2014    Procedure: Glori Luis Cath Insertion;  Surgeon: Algernon Huxley, MD;  Location: [facility];  Service: Cardiovascular;  Laterality: N/A;    FAMILY HISTORY Family  History  Problem Relation Age of Onset  . Stomach cancer Mother   . Melanoma Brother   . Diabetes Brother   . CVA Brother        ADVANCED DIRECTIVES:    HEALTH MAINTENANCE: History  Substance Use Topics  . Smoking status: Former Smoker    Types: Cigarettes    Quit date: 01/14/1978  . Smokeless tobacco: Not on file  . Alcohol Use: No     Colonoscopy:  PAP:  Bone density:  Lipid panel:  No Known Allergies  No current facility-administered medications for this visit.   Current Outpatient Prescriptions  Medication Sig Dispense Refill  . digoxin (LANOXIN) 0.125 MG tablet Take 1 tablet (0.125 mg total) by mouth daily. 30 tablet 0  . diltiazem (CARDIZEM CD) 180 MG 24 hr capsule Take 1 capsule (180 mg total) by mouth daily. 30 capsule 0  . docusate sodium (COLACE) 100 MG capsule Take 1 capsule (100 mg total) by mouth 2 (two) times daily. 10 capsule 0  . feeding supplement, ENSURE ENLIVE, (ENSURE ENLIVE) LIQD Take 237 mLs by mouth 3 (three) times daily with meals. 237 mL 12  . levofloxacin (LEVAQUIN) 750 MG tablet Take 1 tablet (750 mg total) by mouth daily. 7 tablet 0   Facility-Administered Medications Ordered in Other Visits  Medication Dose Route Frequency Provider Last Rate Last Dose  . acetaminophen (TYLENOL) tablet 650 mg  650 mg Oral Q6H PRN Hillary Bow, MD       Or  . acetaminophen (TYLENOL) suppository  650 mg  650 mg Rectal Q6H PRN Srikar Sudini, MD      . albuterol (PROVENTIL) (2.5 MG/3ML) 0.083% nebulizer solution 2.5 mg  2.5 mg Nebulization Q2H PRN Srikar Sudini, MD      . antiseptic oral rinse (CPC / CETYLPYRIDINIUM CHLORIDE 0.05%) solution 7 mL  7 mL Mouth Rinse BID Srikar Sudini, MD   7 mL at 08/12/14 1030  . azithromycin (ZITHROMAX) tablet 500 mg  500 mg Oral Daily Charlett Nose, RPH   500 mg at 08/13/14 0932  . cefTRIAXone (ROCEPHIN) 1 g in dextrose 5 % 50 mL IVPB  1 g Intravenous Q24H Charlett Nose, RPH   1 g at 08/13/14 0932  . digoxin (LANOXIN)  tablet 0.125 mg  0.125 mg Oral Daily Bettey Costa, MD   0.125 mg at 08/13/14 0932  . diltiazem (CARDIZEM CD) 24 hr capsule 180 mg  180 mg Oral Daily Corey Skains, MD   180 mg at 08/13/14 0932  . diphenhydrAMINE (BENADRYL) capsule 25 mg  25 mg Oral QHS PRN Lytle Butte, MD   25 mg at 08/12/14 2155  . docusate sodium (COLACE) capsule 100 mg  100 mg Oral BID Hillary Bow, MD   100 mg at 08/13/14 0932  . enoxaparin (LOVENOX) injection 40 mg  40 mg Subcutaneous Q24H Hillary Bow, MD   40 mg at 08/12/14 1829  . feeding supplement (ENSURE ENLIVE) (ENSURE ENLIVE) liquid 237 mL  237 mL Oral TID WC Flora Lipps, MD   237 mL at 08/13/14 0921  . HYDROcodone-acetaminophen (NORCO/VICODIN) 5-325 MG per tablet 1 tablet  1 tablet Oral Q6H PRN Srikar Sudini, MD      . insulin aspart (novoLOG) injection 0-9 Units  0-9 Units Subcutaneous TID WC Hillary Bow, MD   3 Units at 08/12/14 1829  . LORazepam (ATIVAN) tablet 0.5 mg  0.5 mg Oral QHS PRN Hillary Bow, MD   0.5 mg at 08/11/14 2254  . magic mouthwash  5 mL Oral QID PRN Hillary Bow, MD   10 mL at 08/12/14 1006  . magnesium hydroxide (MILK OF MAGNESIA) suspension 30 mL  30 mL Oral Daily PRN Bettey Costa, MD   30 mL at 08/12/14 1500  . megestrol (MEGACE) 40 MG/ML suspension 400 mg  400 mg Oral Daily Hillary Bow, MD   400 mg at 08/13/14 0932  . ondansetron (ZOFRAN) tablet 4 mg  4 mg Oral Q6H PRN Hillary Bow, MD       Or  . ondansetron (ZOFRAN) injection 4 mg  4 mg Intravenous Q6H PRN Srikar Sudini, MD      . oxyCODONE (Oxy IR/ROXICODONE) immediate release tablet 10 mg  10 mg Oral Q6H PRN Srikar Sudini, MD      . sodium chloride 0.9 % bolus 1,000 mL  1,000 mL Intravenous PRN Harrie Foreman, MD   1,000 mL at 08/10/14 0330    OBJECTIVE: Filed Vitals:   08/03/14 0934  BP: 72/45  Pulse: 97  Temp: 96.9 F (36.1 C)  Resp: 18     Body mass index is 21.29 kg/(m^2).    ECOG FS:2 - Symptomatic, <50% confined to bed  General: Well-developed,  well-nourished, no acute distress. Eyes: anicteric sclera. Lungs: Clear to auscultation bilaterally. Heart: Regular rate and rhythm. No rubs, murmurs, or gallops. Abdomen: Soft, nontender, nondistended. No organomegaly noted, normoactive bowel sounds. Musculoskeletal: No edema, cyanosis, or clubbing. Neuro: Alert, answering all questions appropriately. Cranial nerves grossly intact. Skin: No rashes or petechiae noted.  Psych: Normal affect.    LAB RESULTS:  Lab Results  Component Value Date   NA 140 08/13/2014   K 3.4* 08/13/2014   CL 106 08/13/2014   CO2 27 08/13/2014   GLUCOSE 122* 08/13/2014   BUN 7 08/13/2014   CREATININE 0.55* 08/13/2014   CALCIUM 7.7* 08/13/2014   PROT 4.9* 08/09/2014   ALBUMIN 2.8* 08/09/2014   AST 17 08/09/2014   ALT 25 08/09/2014   ALKPHOS 101 08/09/2014   BILITOT 0.7 08/09/2014   GFRNONAA >60 08/13/2014   GFRAA >60 08/13/2014    Lab Results  Component Value Date   WBC 5.5 08/10/2014   NEUTROABS 11.0* 08/09/2014   HGB 10.8* 08/10/2014   HCT 32.4* 08/10/2014   MCV 84.6 08/10/2014   PLT 81* 08/10/2014     STUDIES: Dg Chest 1 View  08/12/2014   CLINICAL DATA:  81 year old male with a history of new onset shortness of breath. History of non-Hodgkin's lymphoma  EXAM: CHEST  1 VIEW  COMPARISON:  Plain film 08/09/2014, CT 08/09/2014  FINDINGS: Cardiomediastinal silhouette unchanged in size and contour. Borders are somewhat obscured by overlying lung/ pleural disease.  Lung volumes are decreased from the prior with developing airspace disease at the bilateral lung bases. Interval opacification of the left hemidiaphragm and left costophrenic angle.  Unchanged appearance of port catheter on the right chest wall from right IJ approach. The tip of the catheter appears to terminate at the superior cavoatrial junction.  Atherosclerosis.  IMPRESSION: Interval development of left-sided pleural effusion, with worsening airspace disease at the lung bases,  potentially edema or consolidation.  Unchanged right IJ port catheter.  Signed,  Dulcy Fanny. Earleen Newport, DO  [facility]  [facility]   Electronically Signed   By: Corrie Mckusick D.O.   On: 08/12/2014 12:00   Dg Chest 2 View  08/09/2014   CLINICAL DATA:  Non-Hodgkin's lymphoma with recent syncopal episode  EXAM: CHEST - 2 VIEW  COMPARISON:  05/24/2008.  FINDINGS: Cardiac shadow is within normal limits. A right chest wall port is noted in satisfactory position. Mild interstitial changes are noted likely of a chronic nature. Blunting of left costophrenic angle is again seen and stable.  IMPRESSION: Chronic changes without acute abnormality.   Electronically Signed   By: Inez Catalina M.D.   On: 08/09/2014 12:45   Ct Angio Chest Pe W/cm &/or Wo Cm  08/09/2014   CLINICAL DATA:  Dizziness  EXAM: CT ANGIOGRAPHY CHEST WITH CONTRAST  TECHNIQUE: Multidetector CT imaging of the chest was performed using the standard protocol during bolus administration of intravenous contrast. Multiplanar CT image reconstructions and MIPs were obtained to evaluate the vascular anatomy.  CONTRAST:  147m OMNIPAQUE IOHEXOL 350 MG/ML SOLN  COMPARISON:  06/13/2014 and 04/28/2014  FINDINGS: There are no filling defects in the pulmonary arterial tree to suggest acute pulmonary thromboembolism.  Precarinal lymph node is 7 mm and has markedly reduced in size. Additional precarinal lymph node on image 65 is 10 mm and L is also reduced in size. Sub carinal lymph node is 13 mm and was previously 23 mm based on my measurements. Soft tissue prominence of the right hilum has improved. Prevascular nodes are smaller.  Interstitial lung disease within the right upper lobe characterized primarily by interlobular septal thickening is again noted. It is not significantly changed. Background of emphysema is noted. Similar interstitial opacities in the peripheral right middle lobe are not significantly changed.   Interstitial changes and confluent consolidation  in the left lower lobe has increased since the prior study.  A left pleural effusion has increased in size. It is somewhat loculated as it is lobulated and somewhat partition did.  Low-density areas in the spleen are present. There is an area of wedge-shaped low-density. On a previous PET-CT, the spleen was noted to be hypermetabolic. The low-density area in the spleen is inseparable from the hemidiaphragm. Invasion by tumor is not excluded.  No vertebral compression deformity.  No pneumothorax.  Review of the MIP images confirms the above findings.  IMPRESSION: No evidence of acute pulmonary thromboembolism.  Improve mediastinal adenopathy  Left pleural effusion has enlarged and now is loculated.  Pulmonary parenchymal disease in the left lower lobe has progressed. This may represent tumor infiltration. Similar findings in the right lung are stable.  Low-density area in the spleen is present worrisome for tumor. It is inseparable from the left hemidiaphragm and tumor invasion into the hemidiaphragm is not excluded.   Electronically Signed   By: Marybelle Killings M.D.   On: 08/09/2014 15:33    ASSESSMENT: Stage IV diffuse large B-cell lymphoma.  PLAN:    1. Lymphoma: Diagnosis and staging confirmed by bone and bone marrow biopsy. PET scan results reviewed independently. Proceed with cycle 3 of Rituxan plus CVP today. Plan on giving this treatment every 3 weeks. Continue XRT for his back pain. Given patient's advanced age, will not use Adriamycin and dose reduce his chemotherapy onset. Prednisone has also been dose reduced secondary to his advanced age. Return to clinic in 2 days for Neulasta and then in 3 weeks for consideration of cycle 4. Plan to reimage after cycle 6. 2. Poor appetite: Improved, continue Megace.  3. Thrombocytopenia: Mild, secondary to chemotherapy. Monitor.  Patient expressed understanding and was in agreement with this plan. He also  understands that He can call clinic at any time with any questions, concerns, or complaints.    Lloyd Huger, MD   08/13/2014 12:39 PM

## 2014-08-13 NOTE — Progress Notes (Signed)
SATURATION QUALIFICATIONS: (This note is used to comply with regulatory documentation for home oxygen)  Patient Saturations on Room Air at Rest = 80%  Patient Saturations on Room Air while Ambulating = 80%    Patient Saturations on 4 Liters of oxygen while Ambulating = 93%  Please briefly explain why patient needs home oxygen:Patient will benefit from ongoing skilled PT services in home health setting to continue to advance safe functional mobility, address ongoing impairments in balance, and minimize fall risk.  GY:[id number] and Lung cancer Unable to obtain O2 Sats without oxygen while ambulating per O2 Sat of 80 without oxygen at rest.   Patient will be transported home by EMS today 08/13/14. Please deliver both a PORTABLE Tank and a new HOME Oxygen set up at patient's home today so that he can be discharged from the hospital. Thanks!

## 2014-08-14 LAB — CULTURE, BLOOD (ROUTINE X 2)
Culture: NO GROWTH
Culture: NO GROWTH

## 2014-08-16 NOTE — Care Management (Signed)
Unit secretary gave CM a message to call David Beck regarding patient's home oxygen.  Found this patient was discharged home with [facility] home health.  Contacted David Beck with [facility] and agency will contact [name] and address his questions.

## 2014-08-24 ENCOUNTER — Inpatient Hospital Stay: Payer: Medicare Other

## 2014-08-24 ENCOUNTER — Inpatient Hospital Stay (HOSPITAL_BASED_OUTPATIENT_CLINIC_OR_DEPARTMENT_OTHER): Payer: Medicare Other | Admitting: Oncology

## 2014-08-24 ENCOUNTER — Inpatient Hospital Stay: Payer: Medicare Other | Attending: Oncology

## 2014-08-24 VITALS — BP 103/67 | HR 79 | Temp 97.1°F | Resp 16 | Wt 152.1 lb

## 2014-08-24 VITALS — BP 90/49 | HR 80 | Resp 18

## 2014-08-24 DIAGNOSIS — R630 Anorexia: Secondary | ICD-10-CM | POA: Diagnosis not present

## 2014-08-24 DIAGNOSIS — E860 Dehydration: Secondary | ICD-10-CM | POA: Diagnosis not present

## 2014-08-24 DIAGNOSIS — Z7952 Long term (current) use of systemic steroids: Secondary | ICD-10-CM | POA: Insufficient documentation

## 2014-08-24 DIAGNOSIS — Z85820 Personal history of malignant melanoma of skin: Secondary | ICD-10-CM | POA: Diagnosis not present

## 2014-08-24 DIAGNOSIS — Z87891 Personal history of nicotine dependence: Secondary | ICD-10-CM | POA: Diagnosis not present

## 2014-08-24 DIAGNOSIS — E119 Type 2 diabetes mellitus without complications: Secondary | ICD-10-CM | POA: Insufficient documentation

## 2014-08-24 DIAGNOSIS — I959 Hypotension, unspecified: Secondary | ICD-10-CM | POA: Insufficient documentation

## 2014-08-24 DIAGNOSIS — Z5111 Encounter for antineoplastic chemotherapy: Secondary | ICD-10-CM | POA: Insufficient documentation

## 2014-08-24 DIAGNOSIS — Z418 Encounter for other procedures for purposes other than remedying health state: Secondary | ICD-10-CM | POA: Insufficient documentation

## 2014-08-24 DIAGNOSIS — R5383 Other fatigue: Secondary | ICD-10-CM | POA: Diagnosis not present

## 2014-08-24 DIAGNOSIS — Z79899 Other long term (current) drug therapy: Secondary | ICD-10-CM | POA: Insufficient documentation

## 2014-08-24 DIAGNOSIS — C8330 Diffuse large B-cell lymphoma, unspecified site: Secondary | ICD-10-CM

## 2014-08-24 DIAGNOSIS — R5381 Other malaise: Secondary | ICD-10-CM | POA: Insufficient documentation

## 2014-08-24 DIAGNOSIS — R531 Weakness: Secondary | ICD-10-CM | POA: Diagnosis not present

## 2014-08-24 DIAGNOSIS — M1990 Unspecified osteoarthritis, unspecified site: Secondary | ICD-10-CM | POA: Diagnosis not present

## 2014-08-24 DIAGNOSIS — E785 Hyperlipidemia, unspecified: Secondary | ICD-10-CM

## 2014-08-24 DIAGNOSIS — N400 Enlarged prostate without lower urinary tract symptoms: Secondary | ICD-10-CM | POA: Insufficient documentation

## 2014-08-24 LAB — COMPREHENSIVE METABOLIC PANEL
ALK PHOS: 63 U/L (ref 38–126)
ALT: 15 U/L — ABNORMAL LOW (ref 17–63)
ANION GAP: 3 — AB (ref 5–15)
AST: 17 U/L (ref 15–41)
Albumin: 3.1 g/dL — ABNORMAL LOW (ref 3.5–5.0)
BUN: 15 mg/dL (ref 6–20)
CALCIUM: 7.6 mg/dL — AB (ref 8.9–10.3)
CO2: 31 mmol/L (ref 22–32)
Chloride: 103 mmol/L (ref 101–111)
Creatinine, Ser: 0.64 mg/dL (ref 0.61–1.24)
GFR calc Af Amer: >60 mL/min (ref >60)
GFR calc non Af Amer: >60 mL/min (ref >60)
Glucose, Bld: 176 mg/dL — ABNORMAL HIGH (ref 65–99)
Potassium: 4.4 mmol/L (ref 3.5–5.1)
Sodium: 137 mmol/L (ref 135–145)
Total Bilirubin: 0.6 mg/dL (ref 0.3–1.2)
Total Protein: 5.4 g/dL — ABNORMAL LOW (ref 6.5–8.1)

## 2014-08-24 LAB — CBC WITH DIFFERENTIAL/PLATELET
Basophils Absolute: 0.1 10*3/uL (ref 0–0.1)
Basophils Relative: 1 %
Eosinophils Absolute: 0.1 10*3/uL (ref 0–0.7)
Eosinophils Relative: 1 %
HCT: 35.2 % — ABNORMAL LOW (ref 40.0–52.0)
Hemoglobin: 11.4 g/dL — ABNORMAL LOW (ref 13.0–18.0)
LYMPHS ABS: 2 10*3/uL (ref 1.0–3.6)
LYMPHS PCT: 16 %
MCH: 28 pg (ref 26.0–34.0)
MCHC: 32.3 g/dL (ref 32.0–36.0)
MCV: 86.8 fL (ref 80.0–100.0)
Monocytes Absolute: 0.9 10*3/uL (ref 0.2–1.0)
Monocytes Relative: 8 %
NEUTROS ABS: 9 10*3/uL — AB (ref 1.4–6.5)
NEUTROS PCT: 74 %
PLATELETS: 226 10*3/uL (ref 150–440)
RBC: 4.05 MIL/uL — ABNORMAL LOW (ref 4.40–5.90)
RDW: 22.7 % — ABNORMAL HIGH (ref 11.5–14.5)
WBC: 12.1 10*3/uL — ABNORMAL HIGH (ref 3.8–10.6)

## 2014-08-24 MED ORDER — SODIUM CHLORIDE 0.9 % IV SOLN
1.7500 mg | Freq: Once | INTRAVENOUS | Status: AC
  Administered 2014-08-24: 1.8 mg via INTRAVENOUS
  Filled 2014-08-24: qty 1.8

## 2014-08-24 MED ORDER — SODIUM CHLORIDE 0.9 % IV SOLN
Freq: Once | INTRAVENOUS | Status: AC
  Administered 2014-08-24: 11:00:00 via INTRAVENOUS
  Filled 2014-08-24: qty 1000

## 2014-08-24 MED ORDER — ACETAMINOPHEN 325 MG PO TABS
650.0000 mg | ORAL_TABLET | Freq: Once | ORAL | Status: AC
  Administered 2014-08-24: 650 mg via ORAL
  Filled 2014-08-24: qty 2

## 2014-08-24 MED ORDER — SODIUM CHLORIDE 0.9 % IV SOLN
675.0000 mg/m2 | Freq: Once | INTRAVENOUS | Status: AC
  Administered 2014-08-24: 1200 mg via INTRAVENOUS
  Filled 2014-08-24: qty 50

## 2014-08-24 MED ORDER — SODIUM CHLORIDE 0.9 % IV SOLN
Freq: Once | INTRAVENOUS | Status: DC
  Filled 2014-08-24: qty 1000

## 2014-08-24 MED ORDER — SODIUM CHLORIDE 0.9 % IV SOLN
375.0000 mg/m2 | Freq: Once | INTRAVENOUS | Status: AC
  Administered 2014-08-24: 700 mg via INTRAVENOUS
  Filled 2014-08-24: qty 60

## 2014-08-24 MED ORDER — SODIUM CHLORIDE 0.9 % IV SOLN: 375.0000 mg/m2 | Freq: Once | INTRAVENOUS | Status: DC

## 2014-08-24 MED ORDER — PEGFILGRASTIM 6 MG/0.6ML ~~LOC~~ PSKT
6.0000 mg | PREFILLED_SYRINGE | Freq: Once | SUBCUTANEOUS | Status: AC
  Administered 2014-08-24: 6 mg via SUBCUTANEOUS
  Filled 2014-08-24: qty 0.6

## 2014-08-24 MED ORDER — HEPARIN SOD (PORK) LOCK FLUSH 100 UNIT/ML IV SOLN
500.0000 [IU] | Freq: Once | INTRAVENOUS | Status: AC | PRN
  Administered 2014-08-24: 500 [IU]
  Filled 2014-08-24 (×2): qty 5

## 2014-08-24 MED ORDER — DIPHENHYDRAMINE HCL 25 MG PO CAPS
25.0000 mg | ORAL_CAPSULE | Freq: Once | ORAL | Status: AC
  Administered 2014-08-24: 25 mg via ORAL
  Filled 2014-08-24: qty 1

## 2014-08-24 MED ORDER — SODIUM CHLORIDE 0.9 % IV SOLN
Freq: Once | INTRAVENOUS | Status: AC
  Administered 2014-08-24: 12:00:00 via INTRAVENOUS
  Filled 2014-08-24: qty 8

## 2014-08-26 ENCOUNTER — Inpatient Hospital Stay: Payer: Medicare Other

## 2014-08-27 NOTE — Progress Notes (Signed)
David Beck  Telephone:(336) [phone] Fax:(336) [phone]  ID: [name] Setting OB: August 20, 1920  MR#: [id number]  TDV#:[id number]  Patient Care Team: Dion Body, MD as PCP - General (Family Medicine) Algernon Huxley, MD as Consulting Physician (Vascular Surgery)  CHIEF COMPLAINT:  Chief Complaint  Patient presents with  . Follow-up    DLBCL    INTERVAL HISTORY: Patient returns to clinic today for further evaluation and consideration of cycle 4 of Rituxan plus CVP.  He continues to feel weak and fatigued, but otherwise feels well. He continues to have problems with decreased blood pressure and dehydration. He has a fair appetite and his weight is stable. He does not complain of pain today. He has no neurologic complaints. He denies any recent fevers. He denies any dysphasia. He denies any chest pain, cough, or shortness of breath. He denies any nausea, vomiting, or constipation. He has no urinary complaints. Patient offers no further specific complaints today.  REVIEW OF SYSTEMS:   Review of Systems  Constitutional: Positive for malaise/fatigue. Negative for fever.  Respiratory: Negative.   Cardiovascular: Negative.   Neurological: Positive for weakness.    As per HPI. Otherwise, a complete review of systems is negatve.  PAST MEDICAL HISTORY: Past Medical History  Diagnosis Date  . Diabetes mellitus without complication   . DJD (degenerative joint disease)   . BPH (benign prostatic hyperplasia)   . Hyperlipidemia   . History of hip replacement   . Pneumonia   . Melanoma   . Small cell b-cell lymphoma     PAST SURGICAL HISTORY: Past Surgical History  Procedure Laterality Date  . No past surgeries    . Abdominal aortic aneurysm repair      4 years ago  . Joint replacement    . Peripheral vascular catheterization N/A 06/08/2014    Procedure: Glori Luis Cath Insertion;  Surgeon: Algernon Huxley, MD;  Location: [facility];  Service: Cardiovascular;   Laterality: N/A;    FAMILY HISTORY Family History  Problem Relation Age of Onset  . Stomach cancer Mother   . Melanoma Brother   . Diabetes Brother   . CVA Brother        ADVANCED DIRECTIVES:    HEALTH MAINTENANCE: Social History  Substance Use Topics  . Smoking status: Former Smoker    Types: Cigarettes    Quit date: 01/14/1978  . Smokeless tobacco: Not on file  . Alcohol Use: No     Colonoscopy:  PAP:  Bone density:  Lipid panel:  No Known Allergies  Current Outpatient Prescriptions  Medication Sig Dispense Refill  . Alum & Mag Hydroxide-Simeth (MAGIC MOUTHWASH) SOLN Take 5 mLs by mouth 4 (four) times daily as needed for mouth pain. 480 mL 0  . digoxin (LANOXIN) 0.125 MG tablet Take 1 tablet (0.125 mg total) by mouth daily. 30 tablet 0  . diltiazem (CARDIZEM CD) 180 MG 24 hr capsule Take 1 capsule (180 mg total) by mouth daily. 30 capsule 0  . docusate sodium (COLACE) 100 MG capsule Take 1 capsule (100 mg total) by mouth 2 (two) times daily. 10 capsule 0  . feeding supplement, ENSURE ENLIVE, (ENSURE ENLIVE) LIQD Take 237 mLs by mouth 3 (three) times daily with meals. 237 mL 12  . glipiZIDE (GLUCOTROL) 10 MG tablet Take 5 mg by mouth 2 (two) times daily.    Marland Kitchen lidocaine-prilocaine (EMLA) cream Apply 1 application topically as needed. Apply to port area 1-2 hours prior to chemotherapy treatment appointment,  cover with plastic wrap. 30 g 1  . megestrol (MEGACE) 40 MG/ML suspension Take 10 mLs (400 mg total) by mouth daily. 240 mL 2  . Oxycodone HCl 10 MG TABS Take 1 tablet (10 mg total) by mouth every 6 (six) hours as needed. (Patient taking differently: Take 10 mg by mouth every 6 (six) hours as needed (for pain). ) 90 tablet 0  . predniSONE (DELTASONE) 50 MG tablet Take 1 tablet daily for 5 days with each chemotherapy treatment (Patient taking differently: Take 50 mg by mouth daily. Pt takes one tablet daily for 5 days with each chemotherapy treatment.) 30 tablet 2  .  prochlorperazine (COMPAZINE) 10 MG tablet Take 1 tablet (10 mg total) by mouth every 6 (six) hours as needed for nausea or vomiting. 90 tablet 2   Current Facility-Administered Medications  Medication Dose Route Frequency Provider Last Rate Last Dose  . [START ON 08/28/2014] 0.9 %  sodium chloride infusion   Intravenous Once Lloyd Huger, MD        OBJECTIVE: Filed Vitals:   08/24/14 0958  BP: 103/67  Pulse: 79  Temp: 97.1 F (36.2 C)  Resp: 16     Body mass index is 21.83 kg/(m^2).    ECOG FS:2 - Symptomatic, <50% confined to bed  General: Well-developed, well-nourished, no acute distress. Sitting in a wheelchair. Eyes: anicteric sclera. Lungs: Clear to auscultation bilaterally. Heart: Regular rate and rhythm. No rubs, murmurs, or gallops. Abdomen: Soft, nontender, nondistended. No organomegaly noted, normoactive bowel sounds. Musculoskeletal: No edema, cyanosis, or clubbing. Neuro: Alert, answering all questions appropriately. Cranial nerves grossly intact. Skin: No rashes or petechiae noted. Psych: Normal affect.    LAB RESULTS:  Lab Results  Component Value Date   NA 137 08/24/2014   K 4.4 08/24/2014   CL 103 08/24/2014   CO2 31 08/24/2014   GLUCOSE 176* 08/24/2014   BUN 15 08/24/2014   CREATININE 0.64 08/24/2014   CALCIUM 7.6* 08/24/2014   PROT 5.4* 08/24/2014   ALBUMIN 3.1* 08/24/2014   AST 17 08/24/2014   ALT 15* 08/24/2014   ALKPHOS 63 08/24/2014   BILITOT 0.6 08/24/2014   GFRNONAA >60 08/24/2014   GFRAA >60 08/24/2014    Lab Results  Component Value Date   WBC 12.1* 08/24/2014   NEUTROABS 9.0* 08/24/2014   HGB 11.4* 08/24/2014   HCT 35.2* 08/24/2014   MCV 86.8 08/24/2014   PLT 226 08/24/2014     STUDIES: Dg Chest 1 View  08/12/2014   CLINICAL DATA:  81 year old male with a history of new onset shortness of breath. History of non-Hodgkin's lymphoma  EXAM: CHEST  1 VIEW  COMPARISON:  Plain film 08/09/2014, CT 08/09/2014  FINDINGS:  Cardiomediastinal silhouette unchanged in size and contour. Borders are somewhat obscured by overlying lung/ pleural disease.  Lung volumes are decreased from the prior with developing airspace disease at the bilateral lung bases. Interval opacification of the left hemidiaphragm and left costophrenic angle.  Unchanged appearance of port catheter on the right chest wall from right IJ approach. The tip of the catheter appears to terminate at the superior cavoatrial junction.  Atherosclerosis.  IMPRESSION: Interval development of left-sided pleural effusion, with worsening airspace disease at the lung bases, potentially edema or consolidation.  Unchanged right IJ port catheter.  Signed,  Dulcy Fanny. Earleen Newport, DO  [facility]  [facility] [place] [facility]   Electronically Signed   By: Corrie Mckusick D.O.   On: 08/12/2014 12:00   Dg Chest  2 View  08/09/2014   CLINICAL DATA:  Non-Hodgkin's lymphoma with recent syncopal episode  EXAM: CHEST - 2 VIEW  COMPARISON:  05/24/2008.  FINDINGS: Cardiac shadow is within normal limits. A right chest wall port is noted in satisfactory position. Mild interstitial changes are noted likely of a chronic nature. Blunting of left costophrenic angle is again seen and stable.  IMPRESSION: Chronic changes without acute abnormality.   Electronically Signed   By: Inez Catalina M.D.   On: 08/09/2014 12:45   Ct Angio Chest Pe W/cm &/or Wo Cm  08/09/2014   CLINICAL DATA:  Dizziness  EXAM: CT ANGIOGRAPHY CHEST WITH CONTRAST  TECHNIQUE: Multidetector CT imaging of the chest was performed using the standard protocol during bolus administration of intravenous contrast. Multiplanar CT image reconstructions and MIPs were obtained to evaluate the vascular anatomy.  CONTRAST:  146m OMNIPAQUE IOHEXOL 350 MG/ML SOLN  COMPARISON:  06/13/2014 and 04/28/2014  FINDINGS: There are no filling defects in the pulmonary arterial tree to suggest acute pulmonary thromboembolism.   Precarinal lymph node is 7 mm and has markedly reduced in size. Additional precarinal lymph node on image 65 is 10 mm and L is also reduced in size. Sub carinal lymph node is 13 mm and was previously 23 mm based on my measurements. Soft tissue prominence of the right hilum has improved. Prevascular nodes are smaller.  Interstitial lung disease within the right upper lobe characterized primarily by interlobular septal thickening is again noted. It is not significantly changed. Background of emphysema is noted. Similar interstitial opacities in the peripheral right middle lobe are not significantly changed.  Interstitial changes and confluent consolidation in the left lower lobe has increased since the prior study.  A left pleural effusion has increased in size. It is somewhat loculated as it is lobulated and somewhat partition did.  Low-density areas in the spleen are present. There is an area of wedge-shaped low-density. On a previous PET-CT, the spleen was noted to be hypermetabolic. The low-density area in the spleen is inseparable from the hemidiaphragm. Invasion by tumor is not excluded.  No vertebral compression deformity.  No pneumothorax.  Review of the MIP images confirms the above findings.  IMPRESSION: No evidence of acute pulmonary thromboembolism.  Improve mediastinal adenopathy  Left pleural effusion has enlarged and now is loculated.  Pulmonary parenchymal disease in the left lower lobe has progressed. This may represent tumor infiltration. Similar findings in the right lung are stable.  Low-density area in the spleen is present worrisome for tumor. It is inseparable from the left hemidiaphragm and tumor invasion into the hemidiaphragm is not excluded.   Electronically Signed   By: AMarybelle KillingsM.D.   On: 08/09/2014 15:33    ASSESSMENT: Stage IV diffuse large B-cell lymphoma.  PLAN:    1. Lymphoma: CT scan results in ER for shortness of breath reviewed independently and reveal improved  lymphadenopathy.  Diagnosis and staging confirmed by bone and bone marrow biopsy. PET scan results reviewed independently. Proceed with cycle 4 of Rituxan plus CVP today. Plan on giving this treatment every 3 weeks. Continue XRT for his back pain. Given patient's advanced age, will not use Adriamycin and dose reduce his chemotherapy onset. Prednisone has also been dose reduced secondary to his advanced age. Return to clinic in 2 days for Neulasta and then in 3 weeks for consideration of cycle 5. Plan to reimage after cycle 6. 2. Poor appetite: Improved, continue Megace.  3. Thrombocytopenia: Resolved. Monitor. 4.  Dehydration/low  blood pressure:  Continue IVF as needed in between treatments.  Patient expressed understanding and was in agreement with this plan. He also understands that He can call clinic at any time with any questions, concerns, or complaints.    Lloyd Huger, MD   08/27/2014 10:21 PM

## 2014-08-28 ENCOUNTER — Ambulatory Visit: Payer: Medicare Other

## 2014-08-28 ENCOUNTER — Inpatient Hospital Stay: Payer: Medicare Other

## 2014-08-28 VITALS — BP 100/62 | HR 108 | Temp 96.0°F | Resp 18

## 2014-08-28 DIAGNOSIS — C8330 Diffuse large B-cell lymphoma, unspecified site: Secondary | ICD-10-CM

## 2014-08-28 DIAGNOSIS — Z5111 Encounter for antineoplastic chemotherapy: Secondary | ICD-10-CM | POA: Diagnosis not present

## 2014-08-28 MED ORDER — HEPARIN SOD (PORK) LOCK FLUSH 100 UNIT/ML IV SOLN
500.0000 [IU] | Freq: Once | INTRAVENOUS | Status: AC
  Administered 2014-08-28: 500 [IU] via INTRAVENOUS
  Filled 2014-08-28: qty 5

## 2014-08-28 MED ORDER — SODIUM CHLORIDE 0.9 % IV SOLN: Freq: Once | INTRAVENOUS | Status: DC

## 2014-08-28 MED ORDER — SODIUM CHLORIDE 0.9 % IV SOLN
Freq: Once | INTRAVENOUS | Status: AC
  Administered 2014-08-28: 10:00:00 via INTRAVENOUS
  Filled 2014-08-28: qty 1000

## 2014-09-14 ENCOUNTER — Other Ambulatory Visit: Payer: Medicare Other

## 2014-09-14 ENCOUNTER — Telehealth: Payer: Self-pay | Admitting: *Deleted

## 2014-09-14 ENCOUNTER — Ambulatory Visit: Payer: Medicare Other

## 2014-09-14 ENCOUNTER — Ambulatory Visit: Payer: Medicare Other | Admitting: Oncology

## 2014-09-14 NOTE — Telephone Encounter (Signed)
Needs a prescription faxed to [facility] for a Wheelchair   [phone]

## 2014-09-14 NOTE — Telephone Encounter (Signed)
Rx faxed

## 2014-09-19 ENCOUNTER — Telehealth: Payer: Self-pay | Admitting: *Deleted

## 2014-09-19 NOTE — Telephone Encounter (Signed)
Had IVF after last chemo and then ended up at cardiologist with weeping edema and sob. She states that he is in  Permanent afib now. He can't get lasix because it dehydrates him, when  He gets IVF it overloads him. He has appt for chemo on Thursday and wanted md to be aware of what is going on.

## 2014-09-21 ENCOUNTER — Inpatient Hospital Stay (HOSPITAL_BASED_OUTPATIENT_CLINIC_OR_DEPARTMENT_OTHER): Payer: Medicare Other | Admitting: Oncology

## 2014-09-21 ENCOUNTER — Telehealth: Payer: Self-pay | Admitting: Pharmacist

## 2014-09-21 ENCOUNTER — Inpatient Hospital Stay: Payer: Medicare Other | Attending: Oncology

## 2014-09-21 ENCOUNTER — Inpatient Hospital Stay: Payer: Medicare Other

## 2014-09-21 DIAGNOSIS — Z79899 Other long term (current) drug therapy: Secondary | ICD-10-CM

## 2014-09-21 DIAGNOSIS — R5383 Other fatigue: Secondary | ICD-10-CM

## 2014-09-21 DIAGNOSIS — I4891 Unspecified atrial fibrillation: Secondary | ICD-10-CM | POA: Diagnosis not present

## 2014-09-21 DIAGNOSIS — I959 Hypotension, unspecified: Secondary | ICD-10-CM | POA: Diagnosis not present

## 2014-09-21 DIAGNOSIS — C8330 Diffuse large B-cell lymphoma, unspecified site: Secondary | ICD-10-CM | POA: Diagnosis present

## 2014-09-21 DIAGNOSIS — E860 Dehydration: Secondary | ICD-10-CM

## 2014-09-21 DIAGNOSIS — E785 Hyperlipidemia, unspecified: Secondary | ICD-10-CM | POA: Diagnosis not present

## 2014-09-21 DIAGNOSIS — E119 Type 2 diabetes mellitus without complications: Secondary | ICD-10-CM | POA: Insufficient documentation

## 2014-09-21 DIAGNOSIS — R5381 Other malaise: Secondary | ICD-10-CM

## 2014-09-21 DIAGNOSIS — Z800 Family history of malignant neoplasm of digestive organs: Secondary | ICD-10-CM | POA: Insufficient documentation

## 2014-09-21 DIAGNOSIS — R630 Anorexia: Secondary | ICD-10-CM | POA: Diagnosis not present

## 2014-09-21 DIAGNOSIS — Z7952 Long term (current) use of systemic steroids: Secondary | ICD-10-CM | POA: Insufficient documentation

## 2014-09-21 DIAGNOSIS — R531 Weakness: Secondary | ICD-10-CM | POA: Insufficient documentation

## 2014-09-21 DIAGNOSIS — R600 Localized edema: Secondary | ICD-10-CM | POA: Insufficient documentation

## 2014-09-21 DIAGNOSIS — Z5111 Encounter for antineoplastic chemotherapy: Secondary | ICD-10-CM | POA: Insufficient documentation

## 2014-09-21 DIAGNOSIS — Z85820 Personal history of malignant melanoma of skin: Secondary | ICD-10-CM | POA: Diagnosis not present

## 2014-09-21 DIAGNOSIS — Z87891 Personal history of nicotine dependence: Secondary | ICD-10-CM | POA: Diagnosis not present

## 2014-09-21 LAB — CBC WITH DIFFERENTIAL/PLATELET
Basophils Absolute: 0 10*3/uL (ref 0–0.1)
Basophils Relative: 1 %
EOS ABS: 0.2 10*3/uL (ref 0–0.7)
Eosinophils Relative: 2 %
HEMATOCRIT: 32.1 % — AB (ref 40.0–52.0)
Hemoglobin: 10.6 g/dL — ABNORMAL LOW (ref 13.0–18.0)
LYMPHS ABS: 2.3 10*3/uL (ref 1.0–3.6)
Lymphocytes Relative: 23 %
MCH: 28.4 pg (ref 26.0–34.0)
MCHC: 33 g/dL (ref 32.0–36.0)
MCV: 85.9 fL (ref 80.0–100.0)
MONO ABS: 0.7 10*3/uL (ref 0.2–1.0)
MONOS PCT: 8 %
NEUTROS ABS: 6.5 10*3/uL (ref 1.4–6.5)
NEUTROS PCT: 66 %
Platelets: 216 10*3/uL (ref 150–440)
RBC: 3.73 MIL/uL — ABNORMAL LOW (ref 4.40–5.90)
RDW: 18.7 % — ABNORMAL HIGH (ref 11.5–14.5)
WBC: 9.8 10*3/uL (ref 3.8–10.6)

## 2014-09-21 LAB — COMPREHENSIVE METABOLIC PANEL
ALT: 14 U/L — ABNORMAL LOW (ref 17–63)
ANION GAP: 4 — AB (ref 5–15)
AST: 19 U/L (ref 15–41)
Albumin: 2.8 g/dL — ABNORMAL LOW (ref 3.5–5.0)
Alkaline Phosphatase: 85 U/L (ref 38–126)
BILIRUBIN TOTAL: 0.4 mg/dL (ref 0.3–1.2)
BUN: 13 mg/dL (ref 6–20)
CALCIUM: 7.5 mg/dL — AB (ref 8.9–10.3)
CO2: 32 mmol/L (ref 22–32)
Chloride: 97 mmol/L — ABNORMAL LOW (ref 101–111)
Creatinine, Ser: 0.5 mg/dL — ABNORMAL LOW (ref 0.61–1.24)
GFR calc non Af Amer: >60 mL/min (ref >60)
Glucose, Bld: 110 mg/dL — ABNORMAL HIGH (ref 65–99)
Potassium: 3.5 mmol/L (ref 3.5–5.1)
Sodium: 133 mmol/L — ABNORMAL LOW (ref 135–145)
TOTAL PROTEIN: 5.5 g/dL — AB (ref 6.5–8.1)

## 2014-09-21 MED ORDER — DIPHENHYDRAMINE HCL 25 MG PO CAPS
25.0000 mg | ORAL_CAPSULE | Freq: Once | ORAL | Status: AC
  Administered 2014-09-21: 25 mg via ORAL
  Filled 2014-09-21: qty 1

## 2014-09-21 MED ORDER — SODIUM CHLORIDE 0.9 % IV SOLN
700.0000 mg | Freq: Once | INTRAVENOUS | Status: AC
  Administered 2014-09-21: 700 mg via INTRAVENOUS
  Filled 2014-09-21: qty 60

## 2014-09-21 MED ORDER — VINCRISTINE SULFATE CHEMO INJECTION 1 MG/ML
1.7500 mg | Freq: Once | INTRAVENOUS | Status: AC
  Administered 2014-09-21: 1.8 mg via INTRAVENOUS
  Filled 2014-09-21: qty 1.8

## 2014-09-21 MED ORDER — PEGFILGRASTIM 6 MG/0.6ML ~~LOC~~ PSKT
6.0000 mg | PREFILLED_SYRINGE | Freq: Once | SUBCUTANEOUS | Status: AC
  Administered 2014-09-21: 6 mg via SUBCUTANEOUS
  Filled 2014-09-21: qty 0.6

## 2014-09-21 MED ORDER — SODIUM CHLORIDE 0.9 % IV SOLN: 375.0000 mg/m2 | Freq: Once | INTRAVENOUS | Status: DC

## 2014-09-21 MED ORDER — SODIUM CHLORIDE 0.9 % IV SOLN
Freq: Once | INTRAVENOUS | Status: AC
  Administered 2014-09-21: 11:00:00 via INTRAVENOUS
  Filled 2014-09-21: qty 8

## 2014-09-21 MED ORDER — SODIUM CHLORIDE 0.9 % IV SOLN
675.0000 mg/m2 | Freq: Once | INTRAVENOUS | Status: AC
  Administered 2014-09-21: 1200 mg via INTRAVENOUS
  Filled 2014-09-21: qty 10

## 2014-09-21 MED ORDER — HEPARIN SOD (PORK) LOCK FLUSH 100 UNIT/ML IV SOLN
500.0000 [IU] | Freq: Once | INTRAVENOUS | Status: AC | PRN
  Administered 2014-09-21: 500 [IU]
  Filled 2014-09-21: qty 5

## 2014-09-21 MED ORDER — ACETAMINOPHEN 325 MG PO TABS
650.0000 mg | ORAL_TABLET | Freq: Once | ORAL | Status: AC
  Administered 2014-09-21: 650 mg via ORAL
  Filled 2014-09-21: qty 2

## 2014-09-21 MED ORDER — SODIUM CHLORIDE 0.9 % IV SOLN
Freq: Once | INTRAVENOUS | Status: AC
  Administered 2014-09-21: 10:00:00 via INTRAVENOUS
  Filled 2014-09-21: qty 1000

## 2014-09-21 NOTE — Telephone Encounter (Signed)
Patient requested on body Neulasta instead of returning to clinic tomorrow.

## 2014-09-21 NOTE — Addendum Note (Signed)
Addended by: Milas Hock on: 09/21/2014 04:11 PM   Modules accepted: Orders

## 2014-09-21 NOTE — Progress Notes (Signed)
David Beck  [phone] [phone]  ID: [name] Setting OB: 1920/09/06  MR#: [id number]  CNO#:[id number]  Patient Care Team: Dion Body, MD as PCP - General (Family Medicine) Algernon Huxley, MD as Consulting Physician (Vascular Surgery)  CHIEF COMPLAINT:  No chief complaint on file.   INTERVAL HISTORY:  Patient returns to clinic today for further evaluation and consideration of cycle 5 of Rituxan plus CVP.  He continues to feel weak and fatigued, but otherwise feels well. He continues to have problems with decreased blood pressure and dehydration. He has a fair appetite and his weight is stable. Her daughter reports that patient was recently seen by Dr. Nehemiah Massed due to persistent A. fib and lower extremity edema. He is ordered bilateral Unna boots for compression. Patient otherwise reports feeling okay.  REVIEW OF SYSTEMS:   Review of Systems  Constitutional: Positive for malaise/fatigue. Negative for fever, chills, weight loss and diaphoresis.  HENT: Negative for congestion, ear discharge, ear pain, hearing loss, nosebleeds, sore throat and tinnitus.   Eyes: Negative for blurred vision, double vision, photophobia, pain, discharge and redness.  Respiratory: Positive for cough and shortness of breath. Negative for hemoptysis, sputum production, wheezing and stridor.   Cardiovascular: Positive for leg swelling. Negative for chest pain, palpitations, orthopnea, claudication and PND.  Gastrointestinal: Negative for heartburn, nausea, vomiting, abdominal pain, diarrhea, constipation, blood in stool and melena.  Genitourinary: Negative.   Musculoskeletal: Negative.   Skin: Negative.   Neurological: Positive for weakness. Negative for dizziness, tingling, focal weakness, seizures and headaches.  Endo/Heme/Allergies: Does not bruise/bleed easily.  Psychiatric/Behavioral: Negative for depression. The patient is not nervous/anxious and does not have  insomnia.     As per HPI. Otherwise, a complete review of systems is negatve.  PAST MEDICAL HISTORY: Past Medical History  Diagnosis Date  . Diabetes mellitus without complication   . DJD (degenerative joint disease)   . BPH (benign prostatic hyperplasia)   . Hyperlipidemia   . History of hip replacement   . Pneumonia   . Melanoma   . Small cell b-cell lymphoma     PAST SURGICAL HISTORY: Past Surgical History  Procedure Laterality Date  . No past surgeries    . Abdominal aortic aneurysm repair      4 years ago  . Joint replacement    . Peripheral vascular catheterization N/A 06/08/2014    Procedure: Glori Luis Cath Insertion;  Surgeon: Algernon Huxley, MD;  Location: [facility];  Service: Cardiovascular;  Laterality: N/A;    FAMILY HISTORY Family History  Problem Relation Age of Onset  . Stomach cancer Mother   . Melanoma Brother   . Diabetes Brother   . CVA Brother        ADVANCED DIRECTIVES:    HEALTH MAINTENANCE: Social History  Substance Use Topics  . Smoking status: Former Smoker    Types: Cigarettes    Quit date: 01/14/1978  . Smokeless tobacco: Not on file  . Alcohol Use: No     Colonoscopy:  PAP:  Bone density:  Lipid panel:  No Known Allergies  Current Outpatient Prescriptions  Medication Sig Dispense Refill  . Alum & Mag Hydroxide-Simeth (MAGIC MOUTHWASH) SOLN Take 5 mLs by mouth 4 (four) times daily as needed for mouth pain. 480 mL 0  . digoxin (LANOXIN) 0.125 MG tablet Take 1 tablet (0.125 mg total) by mouth daily. 30 tablet 0  . diltiazem (CARDIZEM CD) 180 MG 24 hr capsule Take 1 capsule (180  mg total) by mouth daily. 30 capsule 0  . docusate sodium (COLACE) 100 MG capsule Take 1 capsule (100 mg total) by mouth 2 (two) times daily. 10 capsule 0  . feeding supplement, ENSURE ENLIVE, (ENSURE ENLIVE) LIQD Take 237 mLs by mouth 3 (three) times daily with meals. 237 mL 12  . glipiZIDE (GLUCOTROL) 10 MG tablet Take 5 mg by mouth 2 (two) times  daily.    Marland Kitchen lidocaine-prilocaine (EMLA) cream Apply 1 application topically as needed. Apply to port area 1-2 hours prior to chemotherapy treatment appointment, cover with plastic wrap. 30 g 1  . megestrol (MEGACE) 40 MG/ML suspension Take 10 mLs (400 mg total) by mouth daily. 240 mL 2  . Oxycodone HCl 10 MG TABS Take 1 tablet (10 mg total) by mouth every 6 (six) hours as needed. (Patient taking differently: Take 10 mg by mouth every 6 (six) hours as needed (for pain). ) 90 tablet 0  . predniSONE (DELTASONE) 50 MG tablet Take 1 tablet daily for 5 days with each chemotherapy treatment (Patient taking differently: Take 50 mg by mouth daily. Pt takes one tablet daily for 5 days with each chemotherapy treatment.) 30 tablet 2  . prochlorperazine (COMPAZINE) 10 MG tablet Take 1 tablet (10 mg total) by mouth every 6 (six) hours as needed for nausea or vomiting. 90 tablet 2   No current facility-administered medications for this visit.    OBJECTIVE: There were no vitals filed for this visit.   There is no weight on file to calculate BMI.    ECOG FS:2 - Symptomatic, <50% confined to bed  General: Well-developed, well-nourished, no acute distress. Sitting in a wheelchair. Eyes: anicteric sclera. Lungs: Clear to auscultation bilaterally. Heart: Irregular rate and rhythm, persistent atrial fib. Abdomen: Soft, nontender, nondistended. No organomegaly noted, normoactive bowel sounds. Musculoskeletal: No edema, cyanosis, or clubbing. Neuro: Alert, answering all questions appropriately. Cranial nerves grossly intact. Skin: No rashes or petechiae noted. Psych: Normal affect.    LAB RESULTS:  Lab Results  Component Value Date   NA 137 08/24/2014   K 4.4 08/24/2014   CL 103 08/24/2014   CO2 31 08/24/2014   GLUCOSE 176* 08/24/2014   BUN 15 08/24/2014   CREATININE 0.64 08/24/2014   CALCIUM 7.6* 08/24/2014   PROT 5.4* 08/24/2014   ALBUMIN 3.1* 08/24/2014   AST 17 08/24/2014   ALT 15* 08/24/2014    ALKPHOS 63 08/24/2014   BILITOT 0.6 08/24/2014   GFRNONAA >60 08/24/2014   GFRAA >60 08/24/2014    Lab Results  Component Value Date   WBC 9.8 09/21/2014   NEUTROABS 6.5 09/21/2014   HGB 10.6* 09/21/2014   HCT 32.1* 09/21/2014   MCV 85.9 09/21/2014   PLT 216 09/21/2014     STUDIES: No results found.  ASSESSMENT:  Stage IV diffuse large B-cell lymphoma.  PLAN:    1. Lymphoma: Recent CT scan results in ER reveal improved lymphadenopathy.  Diagnosis and staging confirmed by bone and bone marrow biopsy. Proceed with cycle 5 of Rituxan plus CVP today. Plan on giving this treatment every 3 weeks. Continue XRT for his back pain. Given patient's advanced age, will not use Adriamycin and dose reduce his chemotherapy onset. Prednisone has also been dose reduced secondary to his advanced age. Return to clinic in 2 days for Neulasta and then in 3 weeks for consideration of cycle 5. Plan to reimage after cycle 6. 2. Poor appetite: Improved, continue Megace.  3. Thrombocytopenia: Resolved. Monitor. 4.  Dehydration/low blood  pressure:  Continue IVF as needed in between treatments. 5.  Atrial fib. Patient being followed by cardiology. 6.  Bilateral lower extremity edema. Patient with compression Unna boots on at this time. Home health is due to change today or tomorrow under cardiology management.  Patient expressed understanding and was in agreement with this plan. He also understands that He can call clinic at any time with any questions, concerns, or complaints.   Dr. Grayland Ormond was available for consultation and review of plan of care for this patient.  Evlyn Kanner, NP   09/21/2014 10:08 AM

## 2014-09-22 ENCOUNTER — Inpatient Hospital Stay: Payer: Medicare Other

## 2014-10-05 ENCOUNTER — Ambulatory Visit: Payer: Medicare Other

## 2014-10-05 ENCOUNTER — Ambulatory Visit: Payer: Medicare Other | Admitting: Oncology

## 2014-10-05 ENCOUNTER — Other Ambulatory Visit: Payer: Medicare Other

## 2014-10-12 ENCOUNTER — Inpatient Hospital Stay: Payer: Medicare Other | Admitting: Oncology

## 2014-10-12 ENCOUNTER — Inpatient Hospital Stay: Payer: Medicare Other

## 2014-10-19 ENCOUNTER — Inpatient Hospital Stay (HOSPITAL_BASED_OUTPATIENT_CLINIC_OR_DEPARTMENT_OTHER): Payer: Medicare Other | Admitting: Oncology

## 2014-10-19 ENCOUNTER — Inpatient Hospital Stay: Payer: Medicare Other

## 2014-10-19 ENCOUNTER — Inpatient Hospital Stay: Payer: Medicare Other | Attending: Oncology

## 2014-10-19 VITALS — BP 108/62 | HR 73 | Temp 97.7°F | Wt 148.0 lb

## 2014-10-19 DIAGNOSIS — Z5112 Encounter for antineoplastic immunotherapy: Secondary | ICD-10-CM | POA: Diagnosis not present

## 2014-10-19 DIAGNOSIS — Z85820 Personal history of malignant melanoma of skin: Secondary | ICD-10-CM | POA: Diagnosis not present

## 2014-10-19 DIAGNOSIS — I959 Hypotension, unspecified: Secondary | ICD-10-CM | POA: Insufficient documentation

## 2014-10-19 DIAGNOSIS — I4891 Unspecified atrial fibrillation: Secondary | ICD-10-CM | POA: Insufficient documentation

## 2014-10-19 DIAGNOSIS — R630 Anorexia: Secondary | ICD-10-CM | POA: Insufficient documentation

## 2014-10-19 DIAGNOSIS — R05 Cough: Secondary | ICD-10-CM | POA: Insufficient documentation

## 2014-10-19 DIAGNOSIS — R0602 Shortness of breath: Secondary | ICD-10-CM | POA: Diagnosis not present

## 2014-10-19 DIAGNOSIS — C8330 Diffuse large B-cell lymphoma, unspecified site: Secondary | ICD-10-CM

## 2014-10-19 DIAGNOSIS — Z418 Encounter for other procedures for purposes other than remedying health state: Secondary | ICD-10-CM | POA: Diagnosis not present

## 2014-10-19 DIAGNOSIS — R531 Weakness: Secondary | ICD-10-CM

## 2014-10-19 DIAGNOSIS — E860 Dehydration: Secondary | ICD-10-CM | POA: Insufficient documentation

## 2014-10-19 DIAGNOSIS — R600 Localized edema: Secondary | ICD-10-CM | POA: Diagnosis not present

## 2014-10-19 DIAGNOSIS — N400 Enlarged prostate without lower urinary tract symptoms: Secondary | ICD-10-CM | POA: Insufficient documentation

## 2014-10-19 DIAGNOSIS — R5381 Other malaise: Secondary | ICD-10-CM | POA: Insufficient documentation

## 2014-10-19 DIAGNOSIS — E119 Type 2 diabetes mellitus without complications: Secondary | ICD-10-CM | POA: Diagnosis not present

## 2014-10-19 DIAGNOSIS — Z87891 Personal history of nicotine dependence: Secondary | ICD-10-CM | POA: Insufficient documentation

## 2014-10-19 DIAGNOSIS — Z79899 Other long term (current) drug therapy: Secondary | ICD-10-CM | POA: Diagnosis not present

## 2014-10-19 DIAGNOSIS — E785 Hyperlipidemia, unspecified: Secondary | ICD-10-CM | POA: Insufficient documentation

## 2014-10-19 DIAGNOSIS — Z7952 Long term (current) use of systemic steroids: Secondary | ICD-10-CM | POA: Insufficient documentation

## 2014-10-19 DIAGNOSIS — M1990 Unspecified osteoarthritis, unspecified site: Secondary | ICD-10-CM | POA: Insufficient documentation

## 2014-10-19 DIAGNOSIS — R5383 Other fatigue: Secondary | ICD-10-CM | POA: Insufficient documentation

## 2014-10-19 LAB — COMPREHENSIVE METABOLIC PANEL
ALT: 12 U/L — AB (ref 17–63)
AST: 19 U/L (ref 15–41)
Albumin: 2.7 g/dL — ABNORMAL LOW (ref 3.5–5.0)
Alkaline Phosphatase: 76 U/L (ref 38–126)
Anion gap: 4 — ABNORMAL LOW (ref 5–15)
BILIRUBIN TOTAL: 0.4 mg/dL (ref 0.3–1.2)
BUN: 12 mg/dL (ref 6–20)
CO2: 29 mmol/L (ref 22–32)
CREATININE: 0.55 mg/dL — AB (ref 0.61–1.24)
Calcium: 7.6 mg/dL — ABNORMAL LOW (ref 8.9–10.3)
Chloride: 100 mmol/L — ABNORMAL LOW (ref 101–111)
GFR calc Af Amer: >60 mL/min (ref >60)
Glucose, Bld: 187 mg/dL — ABNORMAL HIGH (ref 65–99)
Potassium: 4 mmol/L (ref 3.5–5.1)
Sodium: 133 mmol/L — ABNORMAL LOW (ref 135–145)
TOTAL PROTEIN: 5.3 g/dL — AB (ref 6.5–8.1)

## 2014-10-19 LAB — CBC WITH DIFFERENTIAL/PLATELET
BASOS ABS: 0.1 10*3/uL (ref 0–0.1)
Basophils Relative: 1 %
Eosinophils Absolute: 0.8 10*3/uL — ABNORMAL HIGH (ref 0–0.7)
Eosinophils Relative: 7 %
HEMATOCRIT: 32.4 % — AB (ref 40.0–52.0)
HEMOGLOBIN: 10.4 g/dL — AB (ref 13.0–18.0)
LYMPHS PCT: 10 %
Lymphs Abs: 1.1 10*3/uL (ref 1.0–3.6)
MCH: 28 pg (ref 26.0–34.0)
MCHC: 32.2 g/dL (ref 32.0–36.0)
MCV: 86.8 fL (ref 80.0–100.0)
Monocytes Absolute: 0.7 10*3/uL (ref 0.2–1.0)
Monocytes Relative: 6 %
NEUTROS ABS: 8.9 10*3/uL — AB (ref 1.4–6.5)
Neutrophils Relative %: 76 %
Platelets: 253 10*3/uL (ref 150–440)
RBC: 3.73 MIL/uL — AB (ref 4.40–5.90)
RDW: 18.2 % — ABNORMAL HIGH (ref 11.5–14.5)
WBC: 11.6 10*3/uL — AB (ref 3.8–10.6)

## 2014-10-19 MED ORDER — SODIUM CHLORIDE 0.9 % IV SOLN: 375.0000 mg/m2 | Freq: Once | INTRAVENOUS | Status: DC

## 2014-10-19 MED ORDER — SODIUM CHLORIDE 0.9 % IV SOLN
375.0000 mg/m2 | Freq: Once | INTRAVENOUS | Status: AC
  Administered 2014-10-19: 700 mg via INTRAVENOUS
  Filled 2014-10-19: qty 20

## 2014-10-19 MED ORDER — PEGFILGRASTIM 6 MG/0.6ML ~~LOC~~ PSKT
6.0000 mg | PREFILLED_SYRINGE | Freq: Once | SUBCUTANEOUS | Status: AC
  Administered 2014-10-19: 6 mg via SUBCUTANEOUS

## 2014-10-19 MED ORDER — DIPHENHYDRAMINE HCL 25 MG PO CAPS
25.0000 mg | ORAL_CAPSULE | Freq: Once | ORAL | Status: AC
  Administered 2014-10-19: 25 mg via ORAL
  Filled 2014-10-19: qty 1

## 2014-10-19 MED ORDER — ACETAMINOPHEN 325 MG PO TABS
650.0000 mg | ORAL_TABLET | Freq: Once | ORAL | Status: AC
  Administered 2014-10-19: 650 mg via ORAL
  Filled 2014-10-19: qty 2

## 2014-10-19 MED ORDER — HEPARIN SOD (PORK) LOCK FLUSH 100 UNIT/ML IV SOLN
500.0000 [IU] | Freq: Once | INTRAVENOUS | Status: AC | PRN
  Administered 2014-10-19: 500 [IU]
  Filled 2014-10-19: qty 5

## 2014-10-19 MED ORDER — SODIUM CHLORIDE 0.9 % IV SOLN
675.0000 mg/m2 | Freq: Once | INTRAVENOUS | Status: AC
  Administered 2014-10-19: 1200 mg via INTRAVENOUS
  Filled 2014-10-19: qty 50

## 2014-10-19 MED ORDER — SODIUM CHLORIDE 0.9 % IV SOLN
Freq: Once | INTRAVENOUS | Status: AC
  Administered 2014-10-19: 12:00:00 via INTRAVENOUS
  Filled 2014-10-19: qty 1000

## 2014-10-19 MED ORDER — SODIUM CHLORIDE 0.9 % IV SOLN
Freq: Once | INTRAVENOUS | Status: AC
  Administered 2014-10-19: 12:00:00 via INTRAVENOUS
  Filled 2014-10-19: qty 8

## 2014-10-19 MED ORDER — VINCRISTINE SULFATE CHEMO INJECTION 1 MG/ML
1.7500 mg | Freq: Once | INTRAVENOUS | Status: AC
  Administered 2014-10-19: 1.8 mg via INTRAVENOUS
  Filled 2014-10-19: qty 1.8

## 2014-10-19 NOTE — Progress Notes (Signed)
Patient complains of cough after eating.  Feels like his throat is constricted and the food does not go down good.  Also has that sensation when first waking up in the morning.  Family wants to be sure order is written for on body injector for neulasta as it is harder to get patient in the car to bring him back for injection.

## 2014-11-03 ENCOUNTER — Encounter: Payer: Self-pay | Admitting: Emergency Medicine

## 2014-11-03 ENCOUNTER — Emergency Department: Payer: Medicare Other

## 2014-11-03 ENCOUNTER — Inpatient Hospital Stay
Admission: EM | Admit: 2014-11-03 | Discharge: 2014-11-14 | DRG: 177 | Disposition: E | Payer: Medicare Other | Attending: Internal Medicine | Admitting: Internal Medicine

## 2014-11-03 ENCOUNTER — Ambulatory Visit: Payer: Medicare Other

## 2014-11-03 DIAGNOSIS — E46 Unspecified protein-calorie malnutrition: Secondary | ICD-10-CM | POA: Diagnosis present

## 2014-11-03 DIAGNOSIS — Z87891 Personal history of nicotine dependence: Secondary | ICD-10-CM | POA: Diagnosis not present

## 2014-11-03 DIAGNOSIS — Z66 Do not resuscitate: Secondary | ICD-10-CM | POA: Diagnosis present

## 2014-11-03 DIAGNOSIS — Z8679 Personal history of other diseases of the circulatory system: Secondary | ICD-10-CM

## 2014-11-03 DIAGNOSIS — Z85820 Personal history of malignant melanoma of skin: Secondary | ICD-10-CM | POA: Diagnosis not present

## 2014-11-03 DIAGNOSIS — Z9981 Dependence on supplemental oxygen: Secondary | ICD-10-CM | POA: Diagnosis not present

## 2014-11-03 DIAGNOSIS — J189 Pneumonia, unspecified organism: Secondary | ICD-10-CM | POA: Diagnosis not present

## 2014-11-03 DIAGNOSIS — Z800 Family history of malignant neoplasm of digestive organs: Secondary | ICD-10-CM | POA: Diagnosis not present

## 2014-11-03 DIAGNOSIS — M1990 Unspecified osteoarthritis, unspecified site: Secondary | ICD-10-CM | POA: Diagnosis present

## 2014-11-03 DIAGNOSIS — R0600 Dyspnea, unspecified: Secondary | ICD-10-CM | POA: Diagnosis not present

## 2014-11-03 DIAGNOSIS — Z6821 Body mass index (BMI) 21.0-21.9, adult: Secondary | ICD-10-CM | POA: Diagnosis not present

## 2014-11-03 DIAGNOSIS — J849 Interstitial pulmonary disease, unspecified: Secondary | ICD-10-CM | POA: Diagnosis not present

## 2014-11-03 DIAGNOSIS — Z833 Family history of diabetes mellitus: Secondary | ICD-10-CM | POA: Diagnosis not present

## 2014-11-03 DIAGNOSIS — R0602 Shortness of breath: Secondary | ICD-10-CM

## 2014-11-03 DIAGNOSIS — Z96649 Presence of unspecified artificial hip joint: Secondary | ICD-10-CM | POA: Diagnosis present

## 2014-11-03 DIAGNOSIS — R001 Bradycardia, unspecified: Secondary | ICD-10-CM | POA: Diagnosis present

## 2014-11-03 DIAGNOSIS — C8330 Diffuse large B-cell lymphoma, unspecified site: Secondary | ICD-10-CM | POA: Diagnosis present

## 2014-11-03 DIAGNOSIS — J9621 Acute and chronic respiratory failure with hypoxia: Secondary | ICD-10-CM | POA: Diagnosis present

## 2014-11-03 DIAGNOSIS — R634 Abnormal weight loss: Secondary | ICD-10-CM | POA: Diagnosis not present

## 2014-11-03 DIAGNOSIS — Z808 Family history of malignant neoplasm of other organs or systems: Secondary | ICD-10-CM

## 2014-11-03 DIAGNOSIS — J690 Pneumonitis due to inhalation of food and vomit: Principal | ICD-10-CM | POA: Diagnosis present

## 2014-11-03 DIAGNOSIS — R590 Localized enlarged lymph nodes: Secondary | ICD-10-CM | POA: Diagnosis not present

## 2014-11-03 DIAGNOSIS — Z823 Family history of stroke: Secondary | ICD-10-CM | POA: Diagnosis not present

## 2014-11-03 DIAGNOSIS — J9811 Atelectasis: Secondary | ICD-10-CM | POA: Diagnosis present

## 2014-11-03 DIAGNOSIS — J9611 Chronic respiratory failure with hypoxia: Secondary | ICD-10-CM | POA: Diagnosis not present

## 2014-11-03 DIAGNOSIS — N400 Enlarged prostate without lower urinary tract symptoms: Secondary | ICD-10-CM | POA: Diagnosis present

## 2014-11-03 DIAGNOSIS — E119 Type 2 diabetes mellitus without complications: Secondary | ICD-10-CM | POA: Diagnosis present

## 2014-11-03 DIAGNOSIS — Z9889 Other specified postprocedural states: Secondary | ICD-10-CM | POA: Diagnosis not present

## 2014-11-03 DIAGNOSIS — J84114 Acute interstitial pneumonitis: Secondary | ICD-10-CM | POA: Diagnosis present

## 2014-11-03 DIAGNOSIS — J449 Chronic obstructive pulmonary disease, unspecified: Secondary | ICD-10-CM | POA: Diagnosis present

## 2014-11-03 DIAGNOSIS — R0902 Hypoxemia: Secondary | ICD-10-CM | POA: Diagnosis not present

## 2014-11-03 DIAGNOSIS — Z79899 Other long term (current) drug therapy: Secondary | ICD-10-CM

## 2014-11-03 DIAGNOSIS — Z9225 Personal history of immunosupression therapy: Secondary | ICD-10-CM | POA: Diagnosis not present

## 2014-11-03 DIAGNOSIS — I4891 Unspecified atrial fibrillation: Secondary | ICD-10-CM | POA: Diagnosis present

## 2014-11-03 DIAGNOSIS — R609 Edema, unspecified: Secondary | ICD-10-CM | POA: Diagnosis present

## 2014-11-03 DIAGNOSIS — J8489 Other specified interstitial pulmonary diseases: Secondary | ICD-10-CM | POA: Diagnosis not present

## 2014-11-03 DIAGNOSIS — I469 Cardiac arrest, cause unspecified: Secondary | ICD-10-CM | POA: Diagnosis present

## 2014-11-03 DIAGNOSIS — J9600 Acute respiratory failure, unspecified whether with hypoxia or hypercapnia: Secondary | ICD-10-CM | POA: Diagnosis not present

## 2014-11-03 LAB — CBC WITH DIFFERENTIAL/PLATELET
BASOS ABS: 0 10*3/uL (ref 0–0.1)
BASOS PCT: 0 %
Eosinophils Absolute: 0 10*3/uL (ref 0–0.7)
Eosinophils Relative: 0 %
HEMATOCRIT: 30.2 % — AB (ref 40.0–52.0)
HEMOGLOBIN: 9.5 g/dL — AB (ref 13.0–18.0)
LYMPHS PCT: 5 %
Lymphs Abs: 0.6 10*3/uL — ABNORMAL LOW (ref 1.0–3.6)
MCH: 27.7 pg (ref 26.0–34.0)
MCHC: 31.6 g/dL — ABNORMAL LOW (ref 32.0–36.0)
MCV: 87.6 fL (ref 80.0–100.0)
MONO ABS: 0.5 10*3/uL (ref 0.2–1.0)
Monocytes Relative: 3 %
NEUTROS ABS: 12.9 10*3/uL — AB (ref 1.4–6.5)
NEUTROS PCT: 92 %
Platelets: 174 10*3/uL (ref 150–440)
RBC: 3.44 MIL/uL — AB (ref 4.40–5.90)
RDW: 18.9 % — AB (ref 11.5–14.5)
WBC: 14.1 10*3/uL — ABNORMAL HIGH (ref 3.8–10.6)

## 2014-11-03 LAB — BLOOD GAS, ARTERIAL
Acid-Base Excess: 4.7 mmol/L — ABNORMAL HIGH (ref 0.0–3.0)
Bicarbonate: 29.2 mEq/L — ABNORMAL HIGH (ref 21.0–28.0)
FIO2: 100
O2 Saturation: 99.7 %
PATIENT TEMPERATURE: 37
PO2 ART: 188 mmHg — AB (ref 83.0–108.0)
pCO2 arterial: 42 mmHg (ref 32.0–48.0)
pH, Arterial: 7.45 (ref 7.350–7.450)

## 2014-11-03 LAB — BASIC METABOLIC PANEL
ANION GAP: 7 (ref 5–15)
BUN: 18 mg/dL (ref 6–20)
CHLORIDE: 102 mmol/L (ref 101–111)
CO2: 30 mmol/L (ref 22–32)
CREATININE: 0.42 mg/dL — AB (ref 0.61–1.24)
Calcium: 8.2 mg/dL — ABNORMAL LOW (ref 8.9–10.3)
GFR calc non Af Amer: >60 mL/min (ref >60)
GLUCOSE: 132 mg/dL — AB (ref 65–99)
POTASSIUM: 3.8 mmol/L (ref 3.5–5.1)
Sodium: 139 mmol/L (ref 135–145)

## 2014-11-03 LAB — GLUCOSE, CAPILLARY: GLUCOSE-CAPILLARY: 91 mg/dL (ref 65–99)

## 2014-11-03 LAB — TROPONIN I: Troponin I: 0.03 ng/mL (ref <0.031)

## 2014-11-03 LAB — LACTIC ACID, PLASMA
LACTIC ACID, VENOUS: 0.9 mmol/L (ref 0.5–2.0)
LACTIC ACID, VENOUS: 1.8 mmol/L (ref 0.5–2.0)

## 2014-11-03 LAB — MRSA PCR SCREENING: MRSA BY PCR: NEGATIVE

## 2014-11-03 MED ORDER — INSULIN ASPART 100 UNIT/ML ~~LOC~~ SOLN
0.0000 [IU] | Freq: Every day | SUBCUTANEOUS | Status: DC
  Administered 2014-11-04: 3 [IU] via SUBCUTANEOUS
  Administered 2014-11-05: 2 [IU] via SUBCUTANEOUS
  Filled 2014-11-03: qty 2
  Filled 2014-11-03: qty 3

## 2014-11-03 MED ORDER — MAGIC MOUTHWASH: 5.0000 mL | Freq: Four times a day (QID) | ORAL | Status: DC | PRN

## 2014-11-03 MED ORDER — INSULIN ASPART 100 UNIT/ML ~~LOC~~ SOLN
0.0000 [IU] | Freq: Three times a day (TID) | SUBCUTANEOUS | Status: DC
  Administered 2014-11-04: 1 [IU] via SUBCUTANEOUS
  Administered 2014-11-04: 2 [IU] via SUBCUTANEOUS
  Administered 2014-11-05: 3 [IU] via SUBCUTANEOUS
  Administered 2014-11-06: 2 [IU] via SUBCUTANEOUS
  Filled 2014-11-03: qty 1
  Filled 2014-11-03: qty 2
  Filled 2014-11-03: qty 3
  Filled 2014-11-03: qty 2

## 2014-11-03 MED ORDER — ACETAMINOPHEN 650 MG RE SUPP: 650.0000 mg | Freq: Four times a day (QID) | RECTAL | Status: DC | PRN

## 2014-11-03 MED ORDER — ALBUTEROL SULFATE (2.5 MG/3ML) 0.083% IN NEBU: 5.0000 mg | INHALATION_SOLUTION | Freq: Once | RESPIRATORY_TRACT | Status: DC

## 2014-11-03 MED ORDER — SODIUM CHLORIDE 0.9 % IJ SOLN
3.0000 mL | Freq: Two times a day (BID) | INTRAMUSCULAR | Status: DC
  Administered 2014-11-03 – 2014-11-06 (×6): 3 mL via INTRAVENOUS

## 2014-11-03 MED ORDER — ONDANSETRON HCL 4 MG PO TABS: 4.0000 mg | ORAL_TABLET | Freq: Four times a day (QID) | ORAL | Status: DC | PRN

## 2014-11-03 MED ORDER — DEXTROSE 5 % IV SOLN
500.0000 mg | INTRAVENOUS | Status: DC
  Administered 2014-11-03 – 2014-11-06 (×3): 500 mg via INTRAVENOUS
  Filled 2014-11-03 (×4): qty 500

## 2014-11-03 MED ORDER — IOHEXOL 350 MG/ML SOLN
75.0000 mL | Freq: Once | INTRAVENOUS | Status: AC | PRN
  Administered 2014-11-03: 75 mL via INTRAVENOUS

## 2014-11-03 MED ORDER — CETYLPYRIDINIUM CHLORIDE 0.05 % MT LIQD
7.0000 mL | Freq: Two times a day (BID) | OROMUCOSAL | Status: DC
  Administered 2014-11-04 – 2014-11-06 (×5): 7 mL via OROMUCOSAL

## 2014-11-03 MED ORDER — METHYLPREDNISOLONE SODIUM SUCC 125 MG IJ SOLR
60.0000 mg | INTRAMUSCULAR | Status: DC
  Administered 2014-11-03 – 2014-11-04 (×2): 60 mg via INTRAVENOUS
  Filled 2014-11-03 (×2): qty 2

## 2014-11-03 MED ORDER — SODIUM CHLORIDE 0.9 % IV BOLUS (SEPSIS)
1000.0000 mL | Freq: Once | INTRAVENOUS | Status: AC
  Administered 2014-11-03: 1000 mL via INTRAVENOUS

## 2014-11-03 MED ORDER — METOPROLOL TARTRATE 1 MG/ML IV SOLN
5.0000 mg | INTRAVENOUS | Status: DC | PRN
  Administered 2014-11-03 – 2014-11-04 (×4): 5 mg via INTRAVENOUS
  Filled 2014-11-03 (×4): qty 5

## 2014-11-03 MED ORDER — DIGOXIN 125 MCG PO TABS
0.1250 mg | ORAL_TABLET | Freq: Every day | ORAL | Status: DC
  Administered 2014-11-05 – 2014-11-06 (×2): 0.125 mg via ORAL
  Filled 2014-11-03 (×3): qty 1

## 2014-11-03 MED ORDER — ENSURE ENLIVE PO LIQD
237.0000 mL | Freq: Three times a day (TID) | ORAL | Status: DC
  Administered 2014-11-05: 237 mL via ORAL

## 2014-11-03 MED ORDER — DOCUSATE SODIUM 100 MG PO CAPS
200.0000 mg | ORAL_CAPSULE | Freq: Two times a day (BID) | ORAL | Status: DC
  Administered 2014-11-04 – 2014-11-06 (×4): 200 mg via ORAL
  Filled 2014-11-03 (×5): qty 2

## 2014-11-03 MED ORDER — ACETAMINOPHEN 325 MG PO TABS: 650.0000 mg | ORAL_TABLET | Freq: Four times a day (QID) | ORAL | Status: DC | PRN

## 2014-11-03 MED ORDER — DOCUSATE SODIUM 100 MG PO CAPS: 100.0000 mg | ORAL_CAPSULE | Freq: Two times a day (BID) | ORAL | Status: DC

## 2014-11-03 MED ORDER — DILTIAZEM HCL ER COATED BEADS 180 MG PO CP24
180.0000 mg | ORAL_CAPSULE | Freq: Every day | ORAL | Status: DC
  Administered 2014-11-05 – 2014-11-06 (×2): 180 mg via ORAL
  Filled 2014-11-03 (×3): qty 1

## 2014-11-03 MED ORDER — ENOXAPARIN SODIUM 40 MG/0.4ML ~~LOC~~ SOLN
40.0000 mg | SUBCUTANEOUS | Status: DC
Start: 2014-11-03 — End: 2014-11-06
  Administered 2014-11-04 – 2014-11-05 (×2): 40 mg via SUBCUTANEOUS
  Filled 2014-11-03 (×2): qty 0.4

## 2014-11-03 MED ORDER — CHLORHEXIDINE GLUCONATE 0.12 % MT SOLN
15.0000 mL | Freq: Two times a day (BID) | OROMUCOSAL | Status: DC
  Administered 2014-11-03 – 2014-11-06 (×8): 15 mL via OROMUCOSAL
  Filled 2014-11-03 (×4): qty 15

## 2014-11-03 MED ORDER — OXYCODONE HCL 5 MG PO TABS: 10.0000 mg | ORAL_TABLET | Freq: Four times a day (QID) | ORAL | Status: DC | PRN

## 2014-11-03 MED ORDER — SODIUM CHLORIDE 0.9 % IV SOLN: 250.0000 mL | INTRAVENOUS | Status: DC | PRN

## 2014-11-03 MED ORDER — LEVOFLOXACIN IN D5W 750 MG/150ML IV SOLN
750.0000 mg | INTRAVENOUS | Status: DC
  Administered 2014-11-03: 750 mg via INTRAVENOUS
  Filled 2014-11-03 (×2): qty 150

## 2014-11-03 MED ORDER — IPRATROPIUM-ALBUTEROL 0.5-2.5 (3) MG/3ML IN SOLN
3.0000 mL | RESPIRATORY_TRACT | Status: DC
  Administered 2014-11-03 – 2014-11-06 (×20): 3 mL via RESPIRATORY_TRACT
  Filled 2014-11-03 (×20): qty 3

## 2014-11-03 MED ORDER — ONDANSETRON HCL 4 MG/2ML IJ SOLN: 4.0000 mg | Freq: Four times a day (QID) | INTRAMUSCULAR | Status: DC | PRN

## 2014-11-03 MED ORDER — SODIUM CHLORIDE 0.9 % IJ SOLN: 3.0000 mL | INTRAMUSCULAR | Status: DC | PRN

## 2014-11-03 MED ORDER — GLIPIZIDE 10 MG PO TABS
5.0000 mg | ORAL_TABLET | Freq: Two times a day (BID) | ORAL | Status: DC
  Administered 2014-11-05 – 2014-11-06 (×3): 5 mg via ORAL
  Filled 2014-11-03 (×4): qty 1

## 2014-11-03 MED ORDER — CEFTRIAXONE SODIUM 1 G IJ SOLR
1.0000 g | INTRAMUSCULAR | Status: DC
  Administered 2014-11-03 – 2014-11-05 (×3): 1 g via INTRAVENOUS
  Filled 2014-11-03 (×4): qty 10

## 2014-11-03 MED ORDER — SODIUM CHLORIDE 0.9 % IJ SOLN
3.0000 mL | Freq: Two times a day (BID) | INTRAMUSCULAR | Status: DC
  Administered 2014-11-04 – 2014-11-06 (×4): 3 mL via INTRAVENOUS

## 2014-11-03 NOTE — ED Notes (Signed)
Respiratory notified.

## 2014-11-03 NOTE — ED Provider Notes (Signed)
[facility] Main Emergency Department Provider Note   ____________________________________________  Time seen: 1315  I have reviewed the triage vital signs and the nursing notes.   HISTORY  Chief Complaint Shortness of Breath   History limited by: Not Limited   HPI David Beck is a 81 y.o. male with history of lymphoma who presents to the emergency department today because of concerns for shortness of breath and hypoxia. The patient has been complaining of some weakness over the course of the past week. It is gradually gotten worse. In addition he has been complaining of some shortness of breath. Seems that this is gotten worse today. He has had some associated congestion. Home health nurse noticed that the patient was hypoxic on his normal 4 L of oxygen. The patient denies any chest pain or fevers.     Past Medical History  Diagnosis Date  . Diabetes mellitus without complication ([place])   . DJD (degenerative joint disease)   . BPH (benign prostatic hyperplasia)   . Hyperlipidemia   . History of hip replacement   . Pneumonia   . Melanoma ([place])   . Small cell B-cell lymphoma [facility])     Patient Active Problem List   Diagnosis Date Noted  . Protein-calorie malnutrition, severe ([place]) 08/11/2014  . Dehydration 08/09/2014  . Atrial flutter ([place]) 08/09/2014  . Benign fibroma of prostate 07/13/2014  . Diabetes mellitus, type 2 ([place]) 07/13/2014  . Arthritis, degenerative 07/13/2014  . Combined fat and carbohydrate induced hyperlipemia 07/13/2014  . Angiopathy 07/13/2014  . DLBCL (diffuse large B cell lymphoma) ([place]) 06/14/2014  . DHL (diffuse histiocytic lymphoma) ([place]) 06/07/2014  . H/O total hip arthroplasty 04/06/2014  . Low back pain with sciatica 04/06/2014  . H/O neoplasm 12/19/2013  . Synovitis of knee 06/09/2013    Past Surgical History  Procedure Laterality Date  . No past surgeries    . Abdominal aortic aneurysm repair      4 years ago   . Joint replacement    . Peripheral vascular catheterization N/A 06/08/2014    Procedure: Glori Luis Cath Insertion;  Surgeon: Algernon Huxley, MD;  Location: [facility];  Service: Cardiovascular;  Laterality: N/A;    Current Outpatient Rx  Name  Route  Sig  Dispense  Refill  . Alum & Mag Hydroxide-Simeth (MAGIC MOUTHWASH) SOLN   Oral   Take 5 mLs by mouth 4 (four) times daily as needed for mouth pain.   480 mL   0   . digoxin (LANOXIN) 0.125 MG tablet   Oral   Take 1 tablet (0.125 mg total) by mouth daily.   30 tablet   0   . diltiazem (CARDIZEM CD) 180 MG 24 hr capsule   Oral   Take 1 capsule (180 mg total) by mouth daily.   30 capsule   0   . docusate sodium (COLACE) 100 MG capsule   Oral   Take 1 capsule (100 mg total) by mouth 2 (two) times daily.   10 capsule   0   . feeding supplement, ENSURE ENLIVE, (ENSURE ENLIVE) LIQD   Oral   Take 237 mLs by mouth 3 (three) times daily with meals.   237 mL   12   . glipiZIDE (GLUCOTROL) 10 MG tablet   Oral   Take 5 mg by mouth 2 (two) times daily.         Marland Kitchen lidocaine-prilocaine (EMLA) cream   Topical   Apply 1 application topically as  needed. Apply to port area 1-2 hours prior to chemotherapy treatment appointment, cover with plastic wrap.   30 g   1   . megestrol (MEGACE) 40 MG/ML suspension   Oral   Take 10 mLs (400 mg total) by mouth daily.   240 mL   2   . Oxycodone HCl 10 MG TABS   Oral   Take 1 tablet (10 mg total) by mouth every 6 (six) hours as needed. Patient taking differently: Take 10 mg by mouth every 6 (six) hours as needed (for pain).    90 tablet   0   . predniSONE (DELTASONE) 50 MG tablet      Take 1 tablet daily for 5 days with each chemotherapy treatment Patient taking differently: Take 50 mg by mouth daily. Pt takes one tablet daily for 5 days with each chemotherapy treatment.   30 tablet   2   . prochlorperazine (COMPAZINE) 10 MG tablet   Oral   Take 1 tablet (10 mg total) by  mouth every 6 (six) hours as needed for nausea or vomiting.   90 tablet   2     Allergies Review of patient's allergies indicates no known allergies.  Family History  Problem Relation Age of Onset  . Stomach cancer Mother   . Melanoma Brother   . Diabetes Brother   . CVA Brother     Social History Social History  Substance Use Topics  . Smoking status: Former Smoker    Types: Cigarettes    Quit date: 01/14/1978  . Smokeless tobacco: None  . Alcohol Use: No    Review of Systems  Constitutional: Negative for fever. Cardiovascular: Negative for chest pain. Respiratory: Positive for shortness of breath. Gastrointestinal: Negative for abdominal pain, vomiting and diarrhea. Genitourinary: Negative for dysuria. Musculoskeletal: Negative for back pain. Skin: Negative for rash. Neurological: Negative for headaches, focal weakness or numbness.  10-point ROS otherwise negative.  ____________________________________________   PHYSICAL EXAM:  VITAL SIGNS: ED Triage Vitals  Enc Vitals Group     BP 10/19/2014 1303 100/49 mmHg     Pulse Rate 10/21/2014 1303 134     Resp 10/25/2014 1303 24     Temp --      Temp src --      SpO2 11/05/2014 1303 80 %     Weight 10/30/2014 1303 139 lb (63.05 kg)     Height 11/04/2014 1303 5\' 10"  (1.778 m)     Head Cir --      Peak Flow --      Pain Score 10/31/2014 1304 0   Constitutional: Alert and oriented. Moderate respiratory distress. 2. Eyes: Conjunctivae are normal. PERRL. Normal extraocular movements. ENT   Head: Normocephalic and atraumatic.   Nose: No congestion/rhinnorhea.   Mouth/Throat: Mucous membranes are moist.   Neck: No stridor. Hematological/Lymphatic/Immunilogical: No cervical lymphadenopathy. Cardiovascular: Tachycardic regular rhythm.   Respiratory: Increased respiratory effort. Moderate respiratory distress. Some crackles appreciated in the lower lungs bilaterally. Gastrointestinal: Soft and nontender. No  distention. There is no CVA tenderness. Genitourinary: Deferred Musculoskeletal: Normal range of motion in all extremities. No joint effusions.  No lower extremity tenderness nor edema. Neurologic:  Normal speech and language. No gross focal neurologic deficits are appreciated. Speech is normal.  Skin:  Skin is warm, dry and intact. No rash noted. Psychiatric: Mood and affect are normal. Speech and behavior are normal. Patient exhibits appropriate insight and judgment.  ____________________________________________    LABS (pertinent positives/negatives)  Labs Reviewed  CBC WITH DIFFERENTIAL/PLATELET - Abnormal; Notable for the following:    WBC 14.1 (*)    RBC 3.44 (*)    Hemoglobin 9.5 (*)    HCT 30.2 (*)    MCHC 31.6 (*)    RDW 18.9 (*)    Neutro Abs 12.9 (*)    Lymphs Abs 0.6 (*)    All other components within normal limits  BASIC METABOLIC PANEL - Abnormal; Notable for the following:    Glucose, Bld 132 (*)    Creatinine, Ser 0.42 (*)    Calcium 8.2 (*)    All other components within normal limits  BLOOD GAS, ARTERIAL - Abnormal; Notable for the following:    pO2, Arterial 188 (*)    Bicarbonate 29.2 (*)    Acid-Base Excess 4.7 (*)    All other components within normal limits  CULTURE, BLOOD (ROUTINE X 2)  CULTURE, BLOOD (ROUTINE X 2)  CULTURE, EXPECTORATED SPUTUM-ASSESSMENT  TROPONIN I  LACTIC ACID, PLASMA  LACTIC ACID, PLASMA     ____________________________________________    RADIOLOGY  Chest x-ray IMPRESSION: No significant interval change from the prior exam. I, Beverlyann Broxterman, personally viewed and evaluated these images (plain radiographs) as part of my medical decision making. ____________________________________________   PROCEDURES  Procedure(s) performed: None  Critical Care performed: Yes, see critical care note(s)  CRITICAL CARE Performed by: Nance Pear   Total critical care time: 35  Critical care time was exclusive of  separately billable procedures and treating other patients.  Critical care was necessary to treat or prevent imminent or life-threatening deterioration.  Critical care was time spent personally by me on the following activities: development of treatment plan with patient and/or surrogate as well as nursing, discussions with consultants, evaluation of patient's response to treatment, examination of patient, obtaining history from patient or surrogate, ordering and performing treatments and interventions, ordering and review of laboratory studies, ordering and review of radiographic studies, pulse oximetry and re-evaluation of patient's condition.  ____________________________________________   INITIAL IMPRESSION / ASSESSMENT AND PLAN / ED COURSE  Pertinent labs & imaging results that were available during my care of the patient were reviewed by me and considered in my medical decision making (see chart for details).  Patient presented to the emergency department today because of concerns for weakness and shortness of breath. Patient is on 4 L of oxygen at home at baseline. Upon presentation here in the emergency department he was found to be hypoxic in the 80s. On my initial exam patient was in some moderate respiratory distress. I did place him on a nonrebreather. His O2 sats did seem to improve on the nonrebreather. I do have some question about how well the pulse oximeter was picking up a true oxygen reading. It did very whilst I was talking to a noted drop down below 70 from time to time. The patient did not seem to be symptomatic at all when his O2 levels dropped and the pulse of the pulse ox did not match the cardiac monitor. However I did ask that an ABG be drawn. ABG did not look terribly concerning. Chest x-ray did not show any significant changes. The patient did have a leukocytosis as I do wonder if some of the worsening breathing was secondary to pneumonia. I did place the patient on  antibiotics. Patient will need admission to the hospital for further workup and evaluation.  ____________________________________________   FINAL CLINICAL IMPRESSION(S) / ED DIAGNOSES  Final diagnoses:  Shortness of breath  Hypoxia  Nance Pear, MD 10/20/2014 [phone]

## 2014-11-03 NOTE — Progress Notes (Signed)
[name]  [phone] [phone]) [phone]  ID: [name] Setting OB: 1920-06-27  MR#: [id number]  LDJ#:[id number]  Patient Care Team: Dion Body, MD as PCP - General (Family Medicine) Algernon Huxley, MD as Consulting Physician (Vascular Surgery)  CHIEF COMPLAINT:  Chief Complaint  Patient presents with  . Follow-up    INTERVAL HISTORY:  Patient returns to clinic today for further evaluation and consideration of cycle 6 of Rituxan plus CVP.  He continues to feel weak and fatigued, but otherwise feels well. He continues to have problems with decreased blood pressure and dehydration. He has a fair appetite and his weight is stable. Patient offers no further specific complaints today.   REVIEW OF SYSTEMS:   Review of Systems  Constitutional: Positive for malaise/fatigue. Negative for fever.  Respiratory: Positive for cough and shortness of breath.   Cardiovascular: Positive for leg swelling.  Gastrointestinal: Negative.   Musculoskeletal: Negative.   Neurological: Negative.   Endo/Heme/Allergies: Does not bruise/bleed easily.    As per HPI. Otherwise, a complete review of systems is negatve.  PAST MEDICAL HISTORY: Past Medical History  Diagnosis Date  . Diabetes mellitus without complication ([place])   . DJD (degenerative joint disease)   . BPH (benign prostatic hyperplasia)   . Hyperlipidemia   . History of hip replacement   . Pneumonia   . Melanoma ([place])   . 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 No past surgeries    . Abdominal aortic aneurysm repair      4 years ago  . Joint replacement    . Peripheral vascular catheterization N/A 06/08/2014    Procedure: Glori Luis Cath Insertion;  Surgeon: Algernon Huxley, MD;  Location: [facility];  Service: Cardiovascular;  Laterality: N/A;    FAMILY HISTORY Family History  Problem Relation Age of Onset  . Stomach  cancer Mother   . Melanoma Brother   . Diabetes Brother   . CVA Brother        ADVANCED DIRECTIVES:    HEALTH MAINTENANCE: Social History  Substance Use Topics  . Smoking status: Former Smoker    Types: Cigarettes    Quit date: 01/14/1978  . Smokeless tobacco: Not on file  . Alcohol Use: No     Colonoscopy:  PAP:  Bone density:  Lipid panel:  No Known Allergies  No current facility-administered medications for this visit.   Current Outpatient Prescriptions  Medication Sig Dispense Refill  . Alum & Mag Hydroxide-Simeth (MAGIC MOUTHWASH) SOLN Take 5 mLs by mouth 4 (four) times daily as needed for mouth pain. 480 mL 0  . digoxin (LANOXIN) 0.125 MG tablet Take 1 tablet (0.125 mg total) by mouth daily. 30 tablet 0  . diltiazem (CARDIZEM CD) 180 MG 24 hr capsule Take 1 capsule (180 mg total) by mouth daily. (Patient not taking: Reported on 11/11/2014) 30 capsule 0  . docusate sodium (COLACE) 100 MG capsule Take 1 capsule (100 mg total) by mouth 2 (two) times daily. (Patient taking differently: Take 200 mg by mouth 2 (two) times daily. ) 10 capsule 0  . feeding supplement, ENSURE ENLIVE, (ENSURE ENLIVE) LIQD Take 237 mLs by mouth 3 (three) times daily with meals. (Patient not taking: Reported on 10/24/2014) 237 mL 12  . glipiZIDE (GLUCOTROL) 10 MG tablet Take 5 mg by mouth 2 (two) times daily.    Marland Kitchen lidocaine-prilocaine (EMLA) cream Apply 1 application topically  as needed. Apply to port area 1-2 hours prior to chemotherapy treatment appointment, cover with plastic wrap. (Patient taking differently: Apply 1 application topically as needed (prior to accessing port). Apply to port area 1-2 hours prior to chemotherapy treatment appointment, cover with plastic wrap.) 30 g 1  . megestrol (MEGACE) 40 MG/ML suspension Take 10 mLs (400 mg total) by mouth daily. 240 mL 2  . Oxycodone HCl 10 MG TABS Take 1 tablet (10 mg total) by mouth every 6 (six) hours as needed. (Patient taking differently:  Take 10 mg by mouth every 6 (six) hours as needed (for pain). ) 90 tablet 0  . predniSONE (DELTASONE) 50 MG tablet Take 1 tablet daily for 5 days with each chemotherapy treatment (Patient taking differently: Take 50 mg by mouth daily. Pt takes one tablet daily for 5 days with each chemotherapy treatment.) 30 tablet 2  . prochlorperazine (COMPAZINE) 10 MG tablet Take 1 tablet (10 mg total) by mouth every 6 (six) hours as needed for nausea or vomiting. 90 tablet 2   Facility-Administered Medications Ordered in Other Visits  Medication Dose Route Frequency Provider Last Rate Last Dose  . 0.9 %  sodium chloride infusion  250 mL Intravenous PRN Hillary Bow, MD      . acetaminophen (TYLENOL) tablet 650 mg  650 mg Oral Q6H PRN Hillary Bow, MD       Or  . acetaminophen (TYLENOL) suppository 650 mg  650 mg Rectal Q6H PRN Hillary Bow, MD      . Derrill Memo ON 11/04/2014] azithromycin (ZITHROMAX) 500 mg in dextrose 5 % 250 mL IVPB  500 mg Intravenous Q24H Srikar Sudini, MD      . cefTRIAXone (ROCEPHIN) 1 g in dextrose 5 % 50 mL IVPB  1 g Intravenous Q24H Srikar Sudini, MD      . docusate sodium (COLACE) capsule 100 mg  100 mg Oral BID Srikar Sudini, MD      . enoxaparin (LOVENOX) injection 40 mg  40 mg Subcutaneous Q24H Srikar Sudini, MD      . insulin aspart (novoLOG) injection 0-5 Units  0-5 Units Subcutaneous QHS Srikar Sudini, MD      . insulin aspart (novoLOG) injection 0-9 Units  0-9 Units Subcutaneous TID WC Srikar Sudini, MD      . ipratropium-albuterol (DUONEB) 0.5-2.5 (3) MG/3ML nebulizer solution 3 mL  3 mL Nebulization Q4H Srikar Sudini, MD   3 mL at 10/15/2014 1703  . levofloxacin (LEVAQUIN) IVPB 750 mg  750 mg Intravenous Q24H Nance Pear, MD 100 mL/hr at 11/04/2014 1526 750 mg at 10/22/2014 1526  . methylPREDNISolone sodium succinate (SOLU-MEDROL) 125 mg/2 mL injection 60 mg  60 mg Intravenous Q24H Srikar Sudini, MD      . ondansetron (ZOFRAN) tablet 4 mg  4 mg Oral Q6H PRN Hillary Bow, MD        Or  . ondansetron (ZOFRAN) injection 4 mg  4 mg Intravenous Q6H PRN Srikar Sudini, MD      . sodium chloride 0.9 % bolus 1,000 mL  1,000 mL Intravenous Once Nance Pear, MD 500 mL/hr at 10/16/2014 1529 1,000 mL at 11/08/2014 1529  . sodium chloride 0.9 % injection 3 mL  3 mL Intravenous Q12H Srikar Sudini, MD      . sodium chloride 0.9 % injection 3 mL  3 mL Intravenous Q12H Srikar Sudini, MD      . sodium chloride 0.9 % injection 3 mL  3 mL Intravenous PRN Hillary Bow, MD  OBJECTIVE: Filed Vitals:   10/19/14 1016  BP: 108/62  Pulse: 73  Temp: 97.7 F (36.5 C)     Body mass index is 21.24 kg/(m^2).    ECOG FS:2 - Symptomatic, <50% confined to bed  General: thin. Sitting in a wheelchair. Eyes: anicteric sclera. Lungs: Clear to auscultation bilaterally. Heart: Irregular rate and rhythm, persistent atrial fib. Abdomen: Soft, nontender, nondistended. No organomegaly noted, normoactive bowel sounds. Musculoskeletal: No edema, cyanosis, or clubbing. Neuro: Alert, answering all questions appropriately. Cranial nerves grossly intact. Skin: No rashes or petechiae noted. Psych: Normal affect.    LAB RESULTS:  Lab Results  Component Value Date   NA 139 10/29/2014   K 3.8 10/28/2014   CL 102 10/20/2014   CO2 30 10/25/2014   GLUCOSE 132* 11/02/2014   BUN 18 10/22/2014   CREATININE 0.42* 11/05/2014   CALCIUM 8.2* 10/21/2014   PROT 5.3* 10/19/2014   ALBUMIN 2.7* 10/19/2014   AST 19 10/19/2014   ALT 12* 10/19/2014   ALKPHOS 76 10/19/2014   BILITOT 0.4 10/19/2014   GFRNONAA >60 10/31/2014   GFRAA >60 11/05/2014    Lab Results  Component Value Date   WBC 14.1* 10/19/2014   NEUTROABS 12.9* 10/17/2014   HGB 9.5* 10/19/2014   HCT 30.2* 10/23/2014   MCV 87.6 11/01/2014   PLT 174 10/21/2014     STUDIES: Dg Chest Portable 1 View  10/16/2014  CLINICAL DATA:  Increased weakness and difficulty breathing for 1 week EXAM: PORTABLE CHEST - 1 VIEW COMPARISON:  08/12/2014  FINDINGS: Cardiac shadow remains enlarged. A right-sided chest wall port is again seen. Stable left-sided pleural effusion is noted. Diffuse interstitial changes are again noted. Persistent bibasilar airspace disease is seen. IMPRESSION: No significant interval change from the prior exam. Electronically Signed   By: Inez Catalina M.D.   On: 10/26/2014 14:17    ASSESSMENT:  Stage IV diffuse large B-cell lymphoma.  PLAN:    1. Lymphoma: Recent CT scan results in ER reveal improved lymphadenopathy.  Diagnosis and staging confirmed by bone and bone marrow biopsy. Patient wishes to proceed with cycle 6 of Rituxan plus CVP today despite his performance status. Given patient's advanced age, will not use Adriamycin and dose reduce his chemotherapy onset. Prednisone has also been dose reduced secondary to his advanced age. Return to clinic in 4 weeks with repeat PET scan and further evaluation. Given patient's declining performance status, it is unlikely further chemotherapy will be an option, but will reassess his imaging. 2. Poor appetite: Improved, continue Megace.  3. Thrombocytopenia: Resolved. Monitor. 4.  Dehydration/low blood pressure:  Continue IVF as needed in between treatments. 5.  Atrial fib. Patient being followed by cardiology. 6.  Bilateral lower extremity edema. Patient with compression Unna boots on at this time.   Patient expressed understanding and was in agreement with this plan. He also understands that He can call clinic at any time with any questions, concerns, or complaints.    Lloyd Huger, MD   11/05/2014 5:06 PM

## 2014-11-03 NOTE — H&P (Signed)
[facility] NAME: David Beck    MR#:  [id number]  DATE OF BIRTH:  [birth year]  DATE OF ADMISSION:  10/18/2014  PRIMARY CARE PHYSICIAN: Dion Body, MD   REQUESTING/REFERRING PHYSICIAN: Dr. Archie Balboa  CHIEF COMPLAINT:   Chief Complaint  Patient presents with  . Shortness of Breath    HISTORY OF PRESENT ILLNESS:  David Beck  is a 81 y.o. male with a known history of lymphoma, COPD, chronic respiratory failure. This oxygen, diabetes presents to the emergency room complaining of worsening shortness of breath and desaturations on 4 L oxygen. Patient felt short of breath briefly yesterday which resolved on its own. Today with ongoing shortness of breath and saturations in the low 80s on 4 L oxygen present to the emergency room. Patient has been found to have bibasilar atelectasis on the chest x-ray. With elevated white count and possibility of pneumonia hospitalist team was contacted. No orthopnea. He does have chronic edema for which he is being treated with Unna boots.  When I arrived in patient's room initially his saturations were 90% on a nonrebreather fluctuating as low as 70. His ABG showed PO2 at 184. His pulse oximetry was thought to be inaccurate. Patient was placed on a nasal cannula at 6 L which caused his saturations to drop and worsening shortness of breath. Immediately respiratory therapist was called and patient placed on BiPAP which improved his saturations into the 90s.  Stat CT scan of the chest was ordered which showed no pulmonary embolism but bibasilar pneumonia and possible worsening changes of lymphoma.   PAST MEDICAL HISTORY:   Past Medical History  Diagnosis Date  . Diabetes mellitus without complication ([place])   . DJD (degenerative joint disease)   . BPH (benign prostatic hyperplasia)   . Hyperlipidemia   . History of hip replacement   . Pneumonia   . Melanoma ([place])   . Small cell B-cell  lymphoma ([place])     PAST SURGICAL HISTORY:   Past Surgical History  Procedure Laterality Date  . No past surgeries    . Abdominal aortic aneurysm repair      4 years ago  . Joint replacement    . Peripheral vascular catheterization N/A 06/08/2014    Procedure: Glori Luis Cath Insertion;  Surgeon: Algernon Huxley, MD;  Location: [facility];  Service: Cardiovascular;  Laterality: N/A;    SOCIAL HISTORY:   Social History  Substance Use Topics  . Smoking status: Former Smoker    Types: Cigarettes    Quit date: 01/14/1978  . Smokeless tobacco: Not on file  . Alcohol Use: No    FAMILY HISTORY:   Family History  Problem Relation Age of Onset  . Stomach cancer Mother   . Melanoma Brother   . Diabetes Brother   . CVA Brother     DRUG ALLERGIES:  No Known Allergies  REVIEW OF SYSTEMS:   Review of Systems  Constitutional: Positive for weight loss and malaise/fatigue. Negative for fever and chills.  HENT: Negative for hearing loss and nosebleeds.   Eyes: Negative for blurred vision, double vision and pain.  Respiratory: Positive for cough, sputum production and shortness of breath. Negative for hemoptysis.   Cardiovascular: Negative for chest pain, palpitations, orthopnea and leg swelling.  Gastrointestinal: Negative for nausea, vomiting, abdominal pain, diarrhea and constipation.  Genitourinary: Negative for dysuria and hematuria.  Musculoskeletal: Positive for myalgias and back pain. Negative for falls.  Skin: Negative  for rash.  Neurological: Positive for weakness. Negative for dizziness, tremors, sensory change, speech change, focal weakness, seizures and headaches.  Endo/Heme/Allergies: Does not bruise/bleed easily.  Psychiatric/Behavioral: Positive for memory loss. Negative for depression. The patient is not nervous/anxious.     MEDICATIONS AT HOME:   Prior to Admission medications   Medication Sig Start Date End Date Taking? Authorizing Provider  Alum & Mag  Hydroxide-Simeth (MAGIC MOUTHWASH) SOLN Take 5 mLs by mouth 4 (four) times daily as needed for mouth pain. 07/26/14  Yes Lloyd Huger, MD  digoxin (LANOXIN) 0.125 MG tablet Take 1 tablet (0.125 mg total) by mouth daily. 08/13/14  Yes Bettey Costa, MD  docusate sodium (COLACE) 100 MG capsule Take 1 capsule (100 mg total) by mouth 2 (two) times daily. Patient taking differently: Take 200 mg by mouth 2 (two) times daily.  08/13/14  Yes Sital Mody, MD  glipiZIDE (GLUCOTROL) 10 MG tablet Take 5 mg by mouth 2 (two) times daily.   Yes Historical Provider, MD  lidocaine-prilocaine (EMLA) cream Apply 1 application topically as needed. Apply to port area 1-2 hours prior to chemotherapy treatment appointment, cover with plastic wrap. Patient taking differently: Apply 1 application topically as needed (prior to accessing port). Apply to port area 1-2 hours prior to chemotherapy treatment appointment, cover with plastic wrap. 06/15/14  Yes Lloyd Huger, MD  megestrol (MEGACE) 40 MG/ML suspension Take 10 mLs (400 mg total) by mouth daily. 06/15/14  Yes Lloyd Huger, MD  Oxycodone HCl 10 MG TABS Take 1 tablet (10 mg total) by mouth every 6 (six) hours as needed. Patient taking differently: Take 10 mg by mouth every 6 (six) hours as needed (for pain).  07/14/14  Yes Lloyd Huger, MD  predniSONE (DELTASONE) 50 MG tablet Take 1 tablet daily for 5 days with each chemotherapy treatment Patient taking differently: Take 50 mg by mouth daily. Pt takes one tablet daily for 5 days with each chemotherapy treatment. 06/01/14  Yes Lloyd Huger, MD  prochlorperazine (COMPAZINE) 10 MG tablet Take 1 tablet (10 mg total) by mouth every 6 (six) hours as needed for nausea or vomiting. 06/22/14  Yes Lloyd Huger, MD  diltiazem (CARDIZEM CD) 180 MG 24 hr capsule Take 1 capsule (180 mg total) by mouth daily. Patient not taking: Reported on 10/23/2014 08/13/14   Bettey Costa, MD  feeding supplement, ENSURE ENLIVE,  (ENSURE ENLIVE) LIQD Take 237 mLs by mouth 3 (three) times daily with meals. Patient not taking: Reported on 10/24/2014 08/13/14   Bettey Costa, MD      VITAL SIGNS:  Blood pressure 100/49, pulse 134, resp. rate 24, height 5\' 10"  (1.778 m), weight 63.05 kg (139 lb), SpO2 80 %.  PHYSICAL EXAMINATION:  Physical Exam  GENERAL:  81 y.o.-year-old patient lying in the bed with no acute distress. Looks critically ill EYES: Pupils equal, round, reactive to light and accommodation. No scleral icterus. Extraocular muscles intact.  HEENT: Head atraumatic, normocephalic. Oropharynx and nasopharynx clear. No oropharyngeal erythema, moist oral mucosa pale NECK:  Supple, no jugular venous distention. No thyroid enlargement, no tenderness.  LUNGS: Poor air entry bilaterally. Some rhonchi left base. Using accessory muscles CARDIOVASCULAR: S1, S2 . No murmurs, rubs, or gallops. Tachycardia. Irregularly irregular. ABDOMEN: Soft, nontender, nondistended. Bowel sounds present. No organomegaly or mass.  EXTREMITIES: No pedal edema, cyanosis, or clubbing. + 2 pedal & radial pulses b/l.   NEUROLOGIC: Cranial nerves II through XII are intact. No focal Motor or sensory deficits  appreciated b/l PSYCHIATRIC: The patient is alert and oriented x 3. Anxious SKIN: No obvious rash, lesion, or ulcer.   LABORATORY PANEL:   CBC  Recent Labs Lab 11/08/2014 1416  WBC 14.1*  HGB 9.5*  HCT 30.2*  PLT 174   ------------------------------------------------------------------------------------------------------------------  Chemistries   Recent Labs Lab 10/20/2014 1416  NA 139  K 3.8  CL 102  CO2 30  GLUCOSE 132*  BUN 18  CREATININE 0.42*  CALCIUM 8.2*   ------------------------------------------------------------------------------------------------------------------  Cardiac Enzymes  Recent Labs Lab 10/29/2014 1416  TROPONINI 0.03    ------------------------------------------------------------------------------------------------------------------  RADIOLOGY:  Dg Chest Portable 1 View  10/22/2014  CLINICAL DATA:  Increased weakness and difficulty breathing for 1 week EXAM: PORTABLE CHEST - 1 VIEW COMPARISON:  08/12/2014 FINDINGS: Cardiac shadow remains enlarged. A right-sided chest wall port is again seen. Stable left-sided pleural effusion is noted. Diffuse interstitial changes are again noted. Persistent bibasilar airspace disease is seen. IMPRESSION: No significant interval change from the prior exam. Electronically Signed   By: Inez Catalina M.D.   On: 11/12/2014 14:17     IMPRESSION AND PLAN:   * Acute on chronic resp failure, hypoxic with Bibasilar pneumonia Bibasilar pneumonia. No pulmonary embolism on CT scan of the chest. Start patient on BiPAP. He quickly desats on nasal cannula and saturations are only 80-85% on a nonrebreather. IV antibiotics. Wean oxygen as tolerated. Scheduled nebulizer therapy. Consult pulmonology. Start Solu-Medrol.  * Afib with RVR Continue home medications. Rapid ventricular rate transient due to pneumonia. Monitor on telemetry.  * Lymphoma Follows at the cancer center. CT scan of the chest shows changes which could be from lymphoma. He will need outpatient oncology follow-up.  * DM Sliding scale insulin. Diabetic diet.  * DVT prophylaxis with Lovenox.  All the records are reviewed and case discussed with ED provider. Management plans discussed with the patient, family and they are in agreement.  CODE STATUS: DNR/DNI  Discussed regarding CODE STATUS with patient initially. Later discussed with wife and son at bedside. We are all in agreement that patient does not want ventilatory support or resuscitation. Orders placed.  TOTAL CC TIME TAKING CARE OF THIS PATIENT: 85 minutes.   Patient was evaluated multiple times in the emergency room.   Hillary Bow R M.D on  10/26/2014 at 4:09 PM  Between 7am to 6pm - Pager - [phone]  After 6pm go to [email or web address] - password [email or web address]  Office  [phone]  CC: Primary care physician; Dion Body, MD   Note: This dictation was prepared with Dragon dictation along with smaller phrase technology. Any transcriptional errors that result from this process are unintentional.

## 2014-11-03 NOTE — ED Notes (Signed)
Pt to ED with c/o increased sob and weakness x 1 week, home health nurse came out and advised that o2 sat were low and increased o2 from 4 liters to 6 liters, pt o2 sat 78% on O2 at this time

## 2014-11-03 NOTE — Progress Notes (Signed)
Pt lying in bed awake and alert with cont . bipap at 55%. Pt was upright in bed attempting to take medicine and  Went to asystole for 11secs and was shaken and opened his eyes and was alert and was not aware that his heart stopped. Pt  Neuro back to baseline . physician made aware and agreed to hold all medications tonight will cont to monitor

## 2014-11-03 NOTE — Progress Notes (Signed)
ED visit made. Patient is followed at home by [facility] with nursing and home health aide.  He has been receiving treatment for Lymphoma, last round of chemo finished on 10/6. Planned CT scan on 11/3, further treatment will be decided at that time. Patient was sent to the [facility] ED by his home health RN, at the direction of Dr. Grayland Ormond,  for evaluation of decreased oxygen saturations and increased weakness.  Patient seen lying on the ED stretcher, alert and interactive, nonrebreather in place, oxygen sats 90-92 %. Wife and son present at bedside. Patient did not appear to be in any distress, staff RN Larene Beach present drawing labs. Writer spoke with ED physician Dr. Archie Balboa, who reports plan will be for patient to be admitted, patient and family made aware. [facility] team up dated. Will continue to follow through final disposition. Flo Shanks RN, BSN, [facility] [phone] c

## 2014-11-03 NOTE — Consult Note (Signed)
PULMONARY / CRITICAL CARE MEDICINE   Name: David Beck MRN: [id number] DOB: 1920-03-20    ADMISSION DATE:  10/15/2014 CONSULTATION DATE:  11/04/14  REFERRING MD :  Dr. Darvin Neighbours   CHIEF COMPLAINT:     Shortness of breath   HISTORY OF PRESENT ILLNESS    HPI - History per chart review and per patient  Patient is a 81 y.o. male PMHx of lymphoma, COPD, chronic respiratory failure on oxygen, and diabetes presents to the emergency room complaining of worsening shortness of breath along with desaturations on 4 L oxygen. He had an episode of dyspnea 2 days ago which self resolved. Yesterday noted to have shortness of breath and saturations in the low 80s on 4 L oxygen which prompted him to present to the emergency room. In the ER, patient has been found to have bibasilar atelectasis on the chest x-ray. With elevated white count and possibility of pneumonia, he was admitted to the hospital. No orthopnea. He does have chronic edema for which he is being treated with Unna boots. Admitting MD, noted that initially his saturations were 90% on a nonrebreather fluctuating and were dropping as low as 70. His ABG showed PO2 at 184. His pulse oximetry was thought to be inaccurate. Patient was placed on a nasal cannula at 6 L which caused his saturations to drop and worsening shortness of breath. Immediately respiratory therapist was called and patient placed on BiPAP which improved his saturations into the 90s. Stat CT scan of the chest was ordered which showed no pulmonary embolism but bibasilar pneumonia and possible worsening changes of lymphoma.  Pulmonary was consulted for further recommendations.     SIGNIFICANT EVENTS  Overnight had a 11 sec run of asystole, but recovered quickly with stimulation.  Fully awake this morning and interactive.  Tolerated bipap initially, but took mask off this morning. Noted to have desats to the mid 80s with [place]@6L  Family at bedside and updated  PAST MEDICAL HISTORY     :  Past Medical History  Diagnosis Date  . Diabetes mellitus without complication ([place])   . DJD (degenerative joint disease)   . BPH (benign prostatic hyperplasia)   . Hyperlipidemia   . History of hip replacement   . Pneumonia   . Melanoma ([place])   . Small cell B-cell lymphoma [facility])    Past Surgical History  Procedure Laterality Date  . No past surgeries    . Abdominal aortic aneurysm repair      4 years ago  . Joint replacement    . Peripheral vascular catheterization N/A 06/08/2014    Procedure: Glori Luis Cath Insertion;  Surgeon: Algernon Huxley, MD;  Location: [facility];  Service: Cardiovascular;  Laterality: N/A;   Prior to Admission medications   Medication Sig Start Date End Date Taking? Authorizing Provider  Alum & Mag Hydroxide-Simeth (MAGIC MOUTHWASH) SOLN Take 5 mLs by mouth 4 (four) times daily as needed for mouth pain. 07/26/14  Yes Lloyd Huger, MD  digoxin (LANOXIN) 0.125 MG tablet Take 1 tablet (0.125 mg total) by mouth daily. 08/13/14  Yes Bettey Costa, MD  docusate sodium (COLACE) 100 MG capsule Take 1 capsule (100 mg total) by mouth 2 (two) times daily. Patient taking differently: Take 200 mg by mouth 2 (two) times daily.  08/13/14  Yes Sital Mody, MD  glipiZIDE (GLUCOTROL) 10 MG tablet Take 5 mg by mouth 2 (two) times daily.   Yes Historical Provider, MD  lidocaine-prilocaine (EMLA) cream Apply 1  application topically as needed. Apply to port area 1-2 hours prior to chemotherapy treatment appointment, cover with plastic wrap. Patient taking differently: Apply 1 application topically as needed (prior to accessing port). Apply to port area 1-2 hours prior to chemotherapy treatment appointment, cover with plastic wrap. 06/15/14  Yes Lloyd Huger, MD  megestrol (MEGACE) 40 MG/ML suspension Take 10 mLs (400 mg total) by mouth daily. 06/15/14  Yes Lloyd Huger, MD  Oxycodone HCl 10 MG TABS Take 1 tablet (10 mg total) by mouth every 6 (six) hours as  needed. Patient taking differently: Take 10 mg by mouth every 6 (six) hours as needed (for pain).  07/14/14  Yes Lloyd Huger, MD  predniSONE (DELTASONE) 50 MG tablet Take 1 tablet daily for 5 days with each chemotherapy treatment Patient taking differently: Take 50 mg by mouth daily. Pt takes one tablet daily for 5 days with each chemotherapy treatment. 06/01/14  Yes Lloyd Huger, MD  prochlorperazine (COMPAZINE) 10 MG tablet Take 1 tablet (10 mg total) by mouth every 6 (six) hours as needed for nausea or vomiting. 06/22/14  Yes Lloyd Huger, MD  diltiazem (CARDIZEM CD) 180 MG 24 hr capsule Take 1 capsule (180 mg total) by mouth daily. Patient not taking: Reported on 10/21/2014 08/13/14   Bettey Costa, MD  feeding supplement, ENSURE ENLIVE, (ENSURE ENLIVE) LIQD Take 237 mLs by mouth 3 (three) times daily with meals. Patient not taking: Reported on 10/20/2014 08/13/14   Bettey Costa, MD   No Known Allergies   FAMILY HISTORY   Family History  Problem Relation Age of Onset  . Stomach cancer Mother   . Melanoma Brother   . Diabetes Brother   . CVA Brother       SOCIAL HISTORY    reports that he quit smoking about 36 years ago. His smoking use included Cigarettes. He does not have any smokeless tobacco history on file. He reports that he does not drink alcohol or use illicit drugs.  Review of Systems  Constitutional: Negative for fever, chills, weight loss and malaise/fatigue.  Eyes: Negative for blurred vision and double vision.  Respiratory: Positive for shortness of breath.   Cardiovascular: Positive for palpitations.  Gastrointestinal: Negative for heartburn and nausea.  Genitourinary: Positive for dysuria. Negative for urgency.  Musculoskeletal: Negative for myalgias.  Skin: Negative for itching.  Neurological: Negative for dizziness and headaches.  Endo/Heme/Allergies: Does not bruise/bleed easily.  Psychiatric/Behavioral: Negative for depression.      VITAL  SIGNS    Temp:  [97.5 F (36.4 C)-97.7 F (36.5 C)] 97.7 F (36.5 C) (10/21 1940) Pulse Rate:  [59-134] 66 (10/21 1940) Resp:  [13-43] 23 (10/21 1940) BP: (100-130)/(49-93) 126/75 mmHg (10/21 1900) SpO2:  [80 %-100 %] 100 % (10/21 1940) FiO2 (%):  [55 %] 55 % (10/21 1940) Weight:  [139 lb (63.05 kg)] 139 lb (63.05 kg) (10/21 1303) HEMODYNAMICS:   VENTILATOR SETTINGS: Vent Mode:  [-]  FiO2 (%):  [55 %] 55 % INTAKE / OUTPUT: No intake or output data in the 24 hours ending 10/27/2014 1942     PHYSICAL EXAM   Physical Exam  Constitutional: He is oriented to person, place, and time.  Cachetic appearance  HENT:  Head: Normocephalic and atraumatic.  Right Ear: External ear normal.  Left Ear: External ear normal.  Eyes: Pupils are equal, round, and reactive to light.  Neck: Normal range of motion. Neck supple.  Cardiovascular: Normal rate, regular rhythm and normal heart sounds.  Pulmonary/Chest: Effort normal. No respiratory distress. He has no wheezes. He has no rales.  Dec BS at the bases.   Abdominal: Soft. Bowel sounds are normal.  Musculoskeletal: Normal range of motion.  Neurological: He is alert and oriented to person, place, and time.  Skin: Skin is warm and dry.  Psychiatric: He has a normal mood and affect.  Nursing note and vitals reviewed.      LABS   LABS:  CBC  Recent Labs Lab 11/13/2014 1416  WBC 14.1*  HGB 9.5*  HCT 30.2*  PLT 174   Coag's No results for input(s): APTT, INR in the last 168 hours. BMET  Recent Labs Lab 10/18/2014 1416  NA 139  K 3.8  CL 102  CO2 30  BUN 18  CREATININE 0.42*  GLUCOSE 132*   Electrolytes  Recent Labs Lab 10/30/2014 1416  CALCIUM 8.2*   Sepsis Markers  Recent Labs Lab 11/02/2014 1519 10/16/2014 1837  LATICACIDVEN 0.9 1.8   ABG  Recent Labs Lab 10/16/2014 1350  PHART 7.45  PCO2ART 42  PO2ART 188*   Liver Enzymes No results for input(s): AST, ALT, ALKPHOS, BILITOT, ALBUMIN in the last 168  hours. Cardiac Enzymes  Recent Labs Lab 11/05/2014 1416  TROPONINI 0.03   Glucose  Recent Labs Lab 11/12/2014 1846  GLUCAP 91     No results found for this or any previous visit (from the past 240 hour(s)).   Current facility-administered medications:  .  0.9 %  sodium chloride infusion, 250 mL, Intravenous, PRN, Hillary Bow, MD .  acetaminophen (TYLENOL) tablet 650 mg, 650 mg, Oral, Q6H PRN **OR** acetaminophen (TYLENOL) suppository 650 mg, 650 mg, Rectal, Q6H PRN, Hillary Bow, MD .  Derrill Memo ON 11/04/2014] azithromycin (ZITHROMAX) 500 mg in dextrose 5 % 250 mL IVPB, 500 mg, Intravenous, Q24H, Srikar Sudini, MD .  cefTRIAXone (ROCEPHIN) 1 g in dextrose 5 % 50 mL IVPB, 1 g, Intravenous, Q24H, Srikar Sudini, MD, Last Rate: 100 mL/hr at 11/12/2014 1716, 1 g at 10/15/2014 1716 .  docusate sodium (COLACE) capsule 100 mg, 100 mg, Oral, BID, Srikar Sudini, MD .  enoxaparin (LOVENOX) injection 40 mg, 40 mg, Subcutaneous, Q24H, Srikar Sudini, MD .  insulin aspart (novoLOG) injection 0-5 Units, 0-5 Units, Subcutaneous, QHS, Srikar Sudini, MD .  insulin aspart (novoLOG) injection 0-9 Units, 0-9 Units, Subcutaneous, TID WC, Srikar Sudini, MD .  ipratropium-albuterol (DUONEB) 0.5-2.5 (3) MG/3ML nebulizer solution 3 mL, 3 mL, Nebulization, Q4H, Srikar Sudini, MD, 3 mL at 10/16/2014 1703 .  levofloxacin (LEVAQUIN) IVPB 750 mg, 750 mg, Intravenous, Q24H, Nance Pear, MD, Stopped at 10/31/2014 1715 .  methylPREDNISolone sodium succinate (SOLU-MEDROL) 125 mg/2 mL injection 60 mg, 60 mg, Intravenous, Q24H, Srikar Sudini, MD, 60 mg at 11/11/2014 1711 .  metoprolol (LOPRESSOR) injection 5 mg, 5 mg, Intravenous, Q4H PRN, Hillary Bow, MD, 5 mg at 11/02/2014 1843 .  ondansetron (ZOFRAN) tablet 4 mg, 4 mg, Oral, Q6H PRN **OR** ondansetron (ZOFRAN) injection 4 mg, 4 mg, Intravenous, Q6H PRN, Srikar Sudini, MD .  sodium chloride 0.9 % injection 3 mL, 3 mL, Intravenous, Q12H, Srikar Sudini, MD .  sodium chloride 0.9  % injection 3 mL, 3 mL, Intravenous, Q12H, Srikar Sudini, MD .  sodium chloride 0.9 % injection 3 mL, 3 mL, Intravenous, PRN, Srikar Sudini, MD  IMAGING    Ct Angio Chest Pe W/cm &/or Wo Cm  11/02/2014  CLINICAL DATA:  Increased shortness of breath and weakness 1 week. Hypoxia. Diffuse large B-cell lymphoma. EXAM:  CT ANGIOGRAPHY CHEST WITH CONTRAST TECHNIQUE: Multidetector CT imaging of the chest was performed using the standard protocol during bolus administration of intravenous contrast. Multiplanar CT image reconstructions and MIPs were obtained to evaluate the vascular anatomy. CONTRAST:  34mL OMNIPAQUE IOHEXOL 350 MG/ML SOLN COMPARISON:  08/09/2014 FINDINGS: Right IJ centri is catheter has tip over the cavoatrial junction. Small to moderate bilateral pleural effusions are present right greater than left which are worse. Minimal loculation of fluid over the lateral left mid to lower lung. There is consolidation over the lower lobes likely atelectasis although cannot exclude infection. There is progressive diffuse bilateral interstitial prominence with patchy areas of mixed interstitial airspace opacification. Small web over the right mainstem bronchus. Heart is normal in size. There is mild calcified plaque over the left main and 3 vessel coronary arteries. Pulmonary arterial system is well opacified as there is no evidence of pulmonary emboli. 1.1 cm precarinal lymph node. Two adjacent 1.1 cm prevascular lymph nodes. 1.6 cm precarinal lymph node 1.3 cm subcarinal lymph node. Adenopathy overall worse compared to the prior exam. No significant hilar or axillary adenopathy. Remaining mediastinal structures are unchanged. Images through the upper abdomen are unchanged. There are moderate degenerative changes of the spine. Review of the MIP images confirms the above findings. IMPRESSION: No evidence pulmonary embolism. Interval progression of diffuse interstitial disease with minimal patchy mixed  interstitial airspace density. Findings may be due to infection versus progression of patient's lymphoma or treatment related changes. Mild worsening of mediastinal adenopathy which may be reactive versus lymphomatous disease. Worsening small to moderate bilateral pleural effusions right greater than left with minimal loculation over the lateral left mid to lower lung. Associated consolidation in the lower lobes likely atelectasis although cannot exclude infection. Atherosclerotic coronary artery disease. Electronically Signed   By: Marin Olp M.D.   On: 10/19/2014 17:25   Dg Chest Portable 1 View  10/18/2014  CLINICAL DATA:  Increased weakness and difficulty breathing for 1 week EXAM: PORTABLE CHEST - 1 VIEW COMPARISON:  08/12/2014 FINDINGS: Cardiac shadow remains enlarged. A right-sided chest wall port is again seen. Stable left-sided pleural effusion is noted. Diffuse interstitial changes are again noted. Persistent bibasilar airspace disease is seen. IMPRESSION: No significant interval change from the prior exam. Electronically Signed   By: Inez Catalina M.D.   On: 11/13/2014 14:17      Indwelling Urinary Catheter continued, requirement due to   Reason to continue Indwelling Urinary Catheter for strict Intake/Output monitoring for hemodynamic instability   Central Line continued, requirement due to   Reason to continue [employer] Monitoring of central venous pressure or other hemodynamic parameters   Ventilator continued, requirement due to, resp failure    Ventilator Sedation RASS 0 to -2   Lines:  Cultures: Bld Cx x 2 10/21>>  Antibiotics: Azithro 10/21>> Rocephin 10/21>>  ASSESSMENT/PLAN  81 yo male with PMHx of lymphoma, COPD (O2 Dependent), DM, presenting with dyspnea, found to have pneumonia on Chest CT.   Acute on chronic resp failure Hypoxia Bibasilar pneumonia vs atelectasis Pulmonary lymphadenopathy Plan: -Cont with IV antibiotics (rocephin and azithro), given  that he is immunocompromised may need to expand coverage to HCAP if clinical condition worsens -Recently started on Rituxan - possible pneumonia, pneumonitis, or respiratory tract infection -Cont with incentive spirometry and bipap -Cont with scheduled nebs -May use solumedrol for the next 24 hrs then wean -follow up blood and sputum cultures -Bipap 2 hrs in the AM and PM, min 4 hours with  sleep -maintain O2 sats >88%   * Afib with RVR - has a known history - cont with home meds - Afib with RVR most likely due to underlying condition - cont with cardiac monitoring.   * Lymphoma - Follows at [facility] cancer center. - poor prognosis given age and current stage of lymphoma - per Dr. Grayland Ormond last note  * DM - SSI - Diabetic diet.  *Social - updated wife, daughter and son at bedside - DNR   I have personally obtained a history, examined the patient, evaluated laboratory and imaging results, formulated the assessment and plan and placed orders.  The Patient requires high complexity decision making for assessment and support, frequent evaluation and titration of therapies, application of advanced monitoring technologies and extensive interpretation of multiple databases. Critical Care Time devoted to patient care services described in this note is 45 minutes.   Overall, patient is critically ill, prognosis is guarded. Patient at high risk for cardiac arrest and death.   Vilinda Boehringer, MD [facility] Pager [phone] (please enter 7-digits) On Call Pager - [phone] (please enter 7-digits)     10/29/2014, 7:42 PM

## 2014-11-04 DIAGNOSIS — J189 Pneumonia, unspecified organism: Secondary | ICD-10-CM

## 2014-11-04 DIAGNOSIS — R0600 Dyspnea, unspecified: Secondary | ICD-10-CM

## 2014-11-04 DIAGNOSIS — R0902 Hypoxemia: Secondary | ICD-10-CM

## 2014-11-04 LAB — CBC
HCT: 31.5 % — ABNORMAL LOW (ref 40.0–52.0)
Hemoglobin: 9.9 g/dL — ABNORMAL LOW (ref 13.0–18.0)
MCH: 27.8 pg (ref 26.0–34.0)
MCHC: 31.6 g/dL — ABNORMAL LOW (ref 32.0–36.0)
MCV: 87.9 fL (ref 80.0–100.0)
PLATELETS: 194 10*3/uL (ref 150–440)
RBC: 3.58 MIL/uL — AB (ref 4.40–5.90)
RDW: 18.9 % — ABNORMAL HIGH (ref 11.5–14.5)
WBC: 13.2 10*3/uL — ABNORMAL HIGH (ref 3.8–10.6)

## 2014-11-04 LAB — BASIC METABOLIC PANEL
Anion gap: 9 (ref 5–15)
BUN: 18 mg/dL (ref 6–20)
CALCIUM: 8.3 mg/dL — AB (ref 8.9–10.3)
CHLORIDE: 104 mmol/L (ref 101–111)
CO2: 27 mmol/L (ref 22–32)
CREATININE: 0.63 mg/dL (ref 0.61–1.24)
GFR calc Af Amer: >60 mL/min (ref >60)
GFR calc non Af Amer: >60 mL/min (ref >60)
GLUCOSE: 187 mg/dL — AB (ref 65–99)
Potassium: 4.2 mmol/L (ref 3.5–5.1)
Sodium: 140 mmol/L (ref 135–145)

## 2014-11-04 LAB — GLUCOSE, CAPILLARY
GLUCOSE-CAPILLARY: 148 mg/dL — AB (ref 65–99)
GLUCOSE-CAPILLARY: 251 mg/dL — AB (ref 65–99)
Glucose-Capillary: 89 mg/dL (ref 65–99)
Glucose-Capillary: 163 mg/dL — ABNORMAL HIGH (ref 65–99)

## 2014-11-04 MED ORDER — DIPHENHYDRAMINE HCL 50 MG/ML IJ SOLN
12.5000 mg | Freq: Once | INTRAMUSCULAR | Status: AC
  Administered 2014-11-04: 12.5 mg via INTRAVENOUS
  Filled 2014-11-04: qty 1

## 2014-11-04 NOTE — Care Management Note (Signed)
Case Management Note  Patient Details  Name: David Beck MRN: [id number] Date of Birth: Jun 11, 1920  Subjective/Objective:   81yo David Beck was admitted 11/10/2014 per respiratory failure and bilateral pneumonia. PCP= Dr Netty Starring. Has a rolling walker at home. Resides with his wife. Chronic 4L N/C supplied by [facility]. [facility] verified that David Beck is still a [facility] home health patient and [facility] currently provides RN and nurse Aide services. David Beck is currently receiving IV antibiotics and is on Bipap. Hx: COPD, B-cell Lymphoma, chronic respiratory failure. Anticipate discharge to home with resumption of [facility] home health services if pneumonia improves significantly. Case management will follow for discharge planning.                 Action/Plan:   Expected Discharge Date:                  Expected Discharge Plan:     In-House Referral:     Discharge planning Services     Post Acute Care Choice:    Choice offered to:     DME Arranged:    DME Agency:     HH Arranged:    [place] Agency:     Status of Service:     Medicare Important Message Given:    Date Medicare IM Given:    Medicare IM give by:    Date Additional Medicare IM Given:    Additional Medicare Important Message give by:     If discussed at [place] of Stay Meetings, dates discussed:    Additional Comments:  Trinaty Bundrick A, RN 11/04/2014, 2:11 PM

## 2014-11-04 NOTE — Progress Notes (Signed)
Pt remained on bipap throughout shift but became increasing irritated with it and refused to cont. Treatment. Pt then placed on [place] at 6l sats 98% at present.will cont to monitor

## 2014-11-04 NOTE — Evaluation (Signed)
Clinical/Bedside Swallow Evaluation Patient Details  Name: David Beck MRN: [id number] Date of Birth: [birth year]  Today's Date: 11/04/2014 Time:        Past Medical History:  Past Medical History  Diagnosis Date  . Diabetes mellitus without complication ([place])   . DJD (degenerative joint disease)   . BPH (benign prostatic hyperplasia)   . Hyperlipidemia   . History of hip replacement   . Pneumonia   . Melanoma ([place])   . Small cell B-cell lymphoma [facility])    Past Surgical History:  Past Surgical History  Procedure Laterality Date  . No past surgeries    . Abdominal aortic aneurysm repair      4 years ago  . Joint replacement    . Peripheral vascular catheterization N/A 06/08/2014    Procedure: Glori Luis Cath Insertion;  Surgeon: Algernon Huxley, MD;  Location: [place] [facility];  Service: Cardiovascular;  Laterality: N/A;   HPI:  Pt had an episode last PM when taking meds where he became unresponsive with heart pause 11 seconds. Pt is aware of the event and reports his throat was very dry when he took the meds. Pt also reports that food often feels like it is "stuck" in his throat.   Assessment / Plan / Recommendation Clinical Impression  Pt presents with immedaite s/s of aspiration characterized by coughing with thin liquids. Pt has Bipap at times during the day but is currently on o2 mask. MD requested SLP eval to determine if Pt can safely take Po's when he is not on Bipap. Pt apparently had an episode of choking with meds last PM. O2 Sats average 95% with mask and at rest. Occasionally dropped to 85 while talking and while taking Po's.Pt tolerated honey thick liquids, purees and solids wihout s/s of aspiration. Pt reported feeling like the solids were hangining in his throat. Rec dysphagia 1 diet with honey thick liquids with strict aspiration precautions. Pt will need periods of rest throughout meals, especially when sats drop. Do not attempt to eat with Bipap on. O2 mask should  remain on for meals  but pulled out just enough for spoon to pass for each bite, sip.Marland Kitchen Discontinue feeding with any s/s of aspiration or if sats continue to drop.    Aspiration Risk  Moderate    Diet Recommendation Dysphagia 1 (Puree)   Medication Administration: Crushed with puree    Other  Recommendations Oral Care Recommendations: Staff/trained caregiver to provide oral care   Follow Up Recommendations       Frequency and Duration min 3x week  1 week   Pertinent Vitals/Pain 0    SLP Swallow Goals     Swallow Study Prior Functional Status       General Date of Onset: 10/23/2014 Other Pertinent Information: Pt had an episode last PM when taking meds where he became unresponsive with heart pause 11 seconds. Pt is aware of the event and reports his throat was very dry when he took the meds. Pt also reports that food often feels like it is "stuck" in his throat. Type of Study: Bedside swallow evaluation Diet Prior to this Study: Dysphagia 3 (soft);Thin liquids Respiratory Status: Supplemental O2 delivered via (comment) (Currently on o2 mask but Pt also needs bibap for hours at a time. ) Behavior/Cognition: Alert;Cooperative Oral Cavity - Dentition: Other (Comment) (Upper and lower dentures) Self-Feeding Abilities: Able to feed self with adaptive devices Baseline Vocal Quality: Hoarse;Low vocal intensity Volitional Cough: Strong Volitional Swallow: Able to  elicit    Oral/Motor/Sensory Function Overall Oral Motor/Sensory Function: Appears within functional limits for tasks assessed Labial ROM: Within Functional Limits Labial Symmetry: Within Functional Limits Labial Strength: Within Functional Limits Labial Sensation: Within Functional Limits Lingual ROM: Within Functional Limits Lingual Symmetry: Within Functional Limits Lingual Strength: Within Functional Limits Lingual Sensation: Within Functional Limits Facial ROM: Within Functional Limits Facial Symmetry: Within  Functional Limits Facial Strength: Within Functional Limits Facial Sensation: Within Functional Limits Velum: Within Functional Limits Mandible: Within Functional Limits   Ice Chips Ice chips: Within functional limits Presentation: Spoon   Thin Liquid Presentation: Spoon;Cup Other Comments: tolerated one Tsp of water. Immediate and continuous coughing after water by cup.    Nectar Thick     Honey Thick Honey Thick Liquid: Within functional limits Other Comments: tolerated 10 sips of honey thick liquid without s/s of aspiration   Puree Puree: Within functional limits Presentation: Spoon Other Comments: Tolerated 10 bites of applesauce without difficulty.   Solid      Presentation: Self Fed Other Comments: able to masticate solids but reports feeling of food sticking in throat. Also needed rest periods. O2 sats dropped.       Lucila Maine 11/04/2014,2:55 PM

## 2014-11-04 NOTE — Progress Notes (Signed)
Initial Nutrition Assessment   INTERVENTION:   Coordination of Care: RN discussing poc with SLP as MD Mungal requesting SLP evaluation this am. Will follow poc as pt currently on bipap. Medical Food Supplement Therapy: agree with order for Ensure Enlive at this time; each supplement provides 350 kcal and 20 grams of protein   NUTRITION DIAGNOSIS:   Inadequate oral intake related to acute illness as evidenced by meal completion < 25%.  GOAL:   Patient will meet greater than or equal to 90% of their needs  MONITOR:    (Energy Intake, Anthropometrics, Pulmonary Profile, glucose Profile, Digestive system)  REASON FOR ASSESSMENT:   Malnutrition Screening Tool    ASSESSMENT:   Pt admitted with acute on chronic respiratory failure and weakness secondary to pna. Pt with h/o lymphoma and COPD. Pt on bipap this am. MD rounding with pt times 2 this morning, resting on Bipap.  Past Medical History  Diagnosis Date  . Diabetes mellitus without complication ([place])   . DJD (degenerative joint disease)   . BPH (benign prostatic hyperplasia)   . Hyperlipidemia   . History of hip replacement   . Pneumonia   . Melanoma ([place])   . 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   Scheduled Medications:  . antiseptic oral rinse  7 mL Mouth Rinse q12n4p  . azithromycin  500 mg Intravenous Q24H  . cefTRIAXone (ROCEPHIN)  IV  1 g Intravenous Q24H  . chlorhexidine  15 mL Mouth Rinse BID  . digoxin  0.125 mg Oral Daily  . diltiazem  180 mg Oral Daily  . docusate sodium  200 mg Oral BID  . enoxaparin (LOVENOX) injection  40 mg Subcutaneous Q24H  . feeding supplement (ENSURE ENLIVE)  237 mL Oral TID WC  . glipiZIDE  5 mg Oral BID  . insulin  aspart  0-5 Units Subcutaneous QHS  . insulin aspart  0-9 Units Subcutaneous TID WC  . ipratropium-albuterol  3 mL Nebulization Q4H  . methylPREDNISolone (SOLU-MEDROL) injection  60 mg Intravenous Q24H  . sodium chloride  3 mL Intravenous Q12H  . sodium chloride  3 mL Intravenous Q12H     Electrolyte/Renal Profile and Glucose Profile:   Recent Labs Lab 11/05/2014 1416 11/04/14 0501  NA 139 140  K 3.8 4.2  CL 102 104  CO2 30 27  BUN 18 18  CREATININE 0.42* 0.63  CALCIUM 8.2* 8.3*  GLUCOSE 132* 187*   Protein Profile: No results for input(s): ALBUMIN in the last 168 hours.  Gastrointestinal Profile: Last BM: unknown   Nutrition-Focused Physical Exam Findings:  Unable to complete Nutrition-Focused physical exam at this time.    Weight Change: Per [facility] weight of 152lbs 2 months ago, current measured weight 151lbs. Weight relatively stable per previous records. Per [facility] positive for weight loss.    Skin:  Reviewed, no issues   Height:   Ht Readings from Last 1 Encounters:  10/17/2014 5\' 10"  (1.778 m)    Weight:   Wt Readings from Last 1 Encounters:  11/04/14 151 lb 14.4 oz (68.9 kg)    Wt Readings from Last 10 Encounters:  11/04/14 151 lb 14.4 oz (68.9 kg)  10/19/14 148 lb (67.132 kg)  08/24/14 152 lb  1.9 oz (69 kg)  08/13/14 142 lb 12.8 oz (64.774 kg)  08/03/14 148 lb 5.9 oz (67.3 kg)  07/13/14 149 lb 4 oz (67.7 kg)  06/29/14 145 lb 4.5 oz (65.9 kg)  06/22/14 145 lb 11.6 oz (66.1 kg)  06/15/14 142 lb 6.7 oz (64.6 kg)  06/13/14 143 lb (64.864 kg)    BMI:  Body mass index is 21.79 kg/(m^2).  Estimated Nutritional Needs:   Kcal:  BEE: 1345kcals, TEE: (IF 1.2-1.4)(AF 1.2) 1937-2260kcals  Protein:  75-90g protein (1.1-1.3g/kg)  Fluid:  1723-207mL of fluid (25-66mL/kg)  EDUCATION NEEDS:   Education needs no appropriate at this time   [place], RD, LDN Pager [phone]

## 2014-11-04 NOTE — Progress Notes (Addendum)
[facility] [place] NAME: David Beck    MR#:  [id number]  DATE OF BIRTH:  03/24/1920  SUBJECTIVE:   I donot like that machine with the tight mask" REVIEW OF SYSTEMS:   Review of Systems  Constitutional: Negative for fever, chills and weight loss.  HENT: Negative for ear discharge, ear pain and nosebleeds.   Eyes: Negative for blurred vision, pain and discharge.  Respiratory: Positive for cough and shortness of breath. Negative for sputum production, wheezing and stridor.   Cardiovascular: Negative for chest pain, palpitations, orthopnea and PND.  Gastrointestinal: Negative for nausea, vomiting, abdominal pain and diarrhea.  Genitourinary: Negative for urgency and frequency.  Musculoskeletal: Negative for back pain and joint pain.  Neurological: Positive for weakness. Negative for sensory change, speech change and focal weakness.  Psychiatric/Behavioral: Negative for depression and hallucinations. The patient is not nervous/anxious.    Tolerating Diet:yes Tolerating PT: not yest  DRUG ALLERGIES:  No Known Allergies  VITALS:  Blood pressure 115/64, pulse 74, temperature 97.1 F (36.2 C), temperature source Axillary, resp. rate 9, height 5\' 10"  (1.778 m), weight 68.9 kg (151 lb 14.4 oz), SpO2 100 %.  PHYSICAL EXAMINATION:   Physical Exam  GENERAL:  81 y.o.-year-old patient lying in the bed with no acute distress. Thin,cachectic, critically ill appearing EYES: Pupils equal, round, reactive to light and accommodation. No scleral icterus. Extraocular muscles intact.  HEENT: Head atraumatic, normocephalic. Oropharynx and nasopharynx clear.  NECK:  Supple, no jugular venous distention. No thyroid enlargement, no tenderness.  LUNGS: distant breath sounds bilaterally, no wheezing, ++rales, norhonchi. No use of accessory muscles of respiration.  CARDIOVASCULAR: S1, S2 normal. No murmurs, rubs, or gallops.  ABDOMEN: Soft, nontender,  nondistended. Bowel sounds present. No organomegaly or mass.  EXTREMITIES: No cyanosis, clubbing or edema b/l.    NEUROLOGIC: Cranial nerves II through XII are intact. No focal Motor or sensory deficits b/l.   PSYCHIATRIC: The patient is alert and oriented x 3.  SKIN: No obvious rash, lesion, or ulcer.    LABORATORY PANEL:   CBC  Recent Labs Lab 11/04/14 0501  WBC 13.2*  HGB 9.9*  HCT 31.5*  PLT 194    Chemistries   Recent Labs Lab 11/04/14 0501  NA 140  K 4.2  CL 104  CO2 27  GLUCOSE 187*  BUN 18  CREATININE 0.63  CALCIUM 8.3*    Cardiac Enzymes  Recent Labs Lab 10/30/2014 1416  TROPONINI 0.03    RADIOLOGY:  Ct Angio Chest Pe W/cm &/or Wo Cm  11/04/2014  CLINICAL DATA:  Increased shortness of breath and weakness 1 week. Hypoxia. Diffuse large B-cell lymphoma. EXAM: CT ANGIOGRAPHY CHEST WITH CONTRAST TECHNIQUE: Multidetector CT imaging of the chest was performed using the standard protocol during bolus administration of intravenous contrast. Multiplanar CT image reconstructions and MIPs were obtained to evaluate the vascular anatomy. CONTRAST:  14mL OMNIPAQUE IOHEXOL 350 MG/ML SOLN COMPARISON:  08/09/2014 FINDINGS: Right IJ centri is catheter has tip over the cavoatrial junction. Small to moderate bilateral pleural effusions are present right greater than left which are worse. Minimal loculation of fluid over the lateral left mid to lower lung. There is consolidation over the lower lobes likely atelectasis although cannot exclude infection. There is progressive diffuse bilateral interstitial prominence with patchy areas of mixed interstitial airspace opacification. Small web over the right mainstem bronchus. Heart is normal in size. There is mild calcified plaque over the left main and 3 vessel  coronary arteries. Pulmonary arterial system is well opacified as there is no evidence of pulmonary emboli. 1.1 cm precarinal lymph node. Two adjacent 1.1 cm prevascular lymph  nodes. 1.6 cm precarinal lymph node 1.3 cm subcarinal lymph node. Adenopathy overall worse compared to the prior exam. No significant hilar or axillary adenopathy. Remaining mediastinal structures are unchanged. Images through the upper abdomen are unchanged. There are moderate degenerative changes of the spine. Review of the MIP images confirms the above findings. IMPRESSION: No evidence pulmonary embolism. Interval progression of diffuse interstitial disease with minimal patchy mixed interstitial airspace density. Findings may be due to infection versus progression of patient's lymphoma or treatment related changes. Mild worsening of mediastinal adenopathy which may be reactive versus lymphomatous disease. Worsening small to moderate bilateral pleural effusions right greater than left with minimal loculation over the lateral left mid to lower lung. Associated consolidation in the lower lobes likely atelectasis although cannot exclude infection. Atherosclerotic coronary artery disease. Electronically Signed   By: Marin Olp M.D.   On: 10/23/2014 17:25   Dg Chest Portable 1 View  10/28/2014  CLINICAL DATA:  Increased weakness and difficulty breathing for 1 week EXAM: PORTABLE CHEST - 1 VIEW COMPARISON:  08/12/2014 FINDINGS: Cardiac shadow remains enlarged. A right-sided chest wall port is again seen. Stable left-sided pleural effusion is noted. Diffuse interstitial changes are again noted. Persistent bibasilar airspace disease is seen. IMPRESSION: No significant interval change from the prior exam. Electronically Signed   By: Inez Catalina M.D.   On: 10/17/2014 14:17     ASSESSMENT AND PLAN:   * Acute on chronic resp failure, hypoxic with Bibasilar pneumonia Bibasilar pneumonia. No pulmonary embolism on CT scan of the chest. Start patient on BiPAP. He quickly desats on nasal cannula and saturations are only 80-85% on a nonrebreather. IV antibiotics. Wean oxygen as tolerated. Scheduled nebulizer  therapy. Consult pulmonology. Start Solu-Medrol.  * Afib with RVR Continue home medications. Rapid ventricular rate transient due to pneumonia. Monitor on telemetry.  * Lymphoma Follows at the cancer center. CT scan of the chest shows changes which could be from lymphoma. He will need outpatient oncology follow-up.  * DM Sliding scale insulin. Diabetic diet.  * DVT prophylaxis with Lovenox.    Case discussed with Care Management/Social Worker. Management plans discussed with the patient, family and they are in agreement.  CODE STATUS: dnr  TOTAL criticalING CARE OF THIS PATIENT: 35inutes.  >50% time spent on counselling and coordination of care pt and family  POSSIBLE D/C IN ?DAYS, DEPENDING ON CLINICAL CONDITION.   Edmund Rick M.D on 11/04/2014 at 12:36 PM  Between 7am to 6pm - Pager - [phone]  After 6pm go to [email or web address] - password [email or web address] [facility]  Office  [phone]  CC: Primary care physician; Dion Body, MD

## 2014-11-04 NOTE — Progress Notes (Signed)
Able to transition patient over thi high flow nasal cannula due to mask discomfort from nonrebreather. Patient has been on 55%40L with saturation noted in mid 90's. Spoke with patient about transitioning over to bipap for sleep. Patient stated no. States he is willing to wear the plastic mask-nonrebreather if he has too but not the big metal mask he wore last night with all the air in it. Will continue to monitor

## 2014-11-04 NOTE — Progress Notes (Signed)
   11/04/14 1100  Clinical Encounter Type  Visited With Patient  Visit Type Initial  Referral From Nurse  Spiritual Encounters  Spiritual Needs Prayer  Stress Factors  Patient Stress Factors Health changes   Chaplain prayed with patient. Offered support and prayers. Will visit when family is present. Marcello Moores

## 2014-11-05 DIAGNOSIS — R590 Localized enlarged lymph nodes: Secondary | ICD-10-CM

## 2014-11-05 DIAGNOSIS — J9611 Chronic respiratory failure with hypoxia: Secondary | ICD-10-CM

## 2014-11-05 DIAGNOSIS — R600 Localized edema: Secondary | ICD-10-CM

## 2014-11-05 DIAGNOSIS — Z9225 Personal history of immunosupression therapy: Secondary | ICD-10-CM

## 2014-11-05 DIAGNOSIS — E785 Hyperlipidemia, unspecified: Secondary | ICD-10-CM

## 2014-11-05 DIAGNOSIS — J8489 Other specified interstitial pulmonary diseases: Secondary | ICD-10-CM

## 2014-11-05 DIAGNOSIS — Z7952 Long term (current) use of systemic steroids: Secondary | ICD-10-CM

## 2014-11-05 DIAGNOSIS — Z79899 Other long term (current) drug therapy: Secondary | ICD-10-CM

## 2014-11-05 LAB — GLUCOSE, CAPILLARY
GLUCOSE-CAPILLARY: 116 mg/dL — AB (ref 65–99)
Glucose-Capillary: 226 mg/dL — ABNORMAL HIGH (ref 65–99)
Glucose-Capillary: 203 mg/dL — ABNORMAL HIGH (ref 65–99)

## 2014-11-05 LAB — BRAIN NATRIURETIC PEPTIDE: B NATRIURETIC PEPTIDE 5: 424 pg/mL — AB (ref 0.0–100.0)

## 2014-11-05 MED ORDER — METHYLPREDNISOLONE SODIUM SUCC 40 MG IJ SOLR
30.0000 mg | INTRAMUSCULAR | Status: DC
  Administered 2014-11-05: 30 mg via INTRAVENOUS
  Filled 2014-11-05: qty 1

## 2014-11-05 NOTE — Consult Note (Signed)
PULMONARY / CRITICAL CARE MEDICINE   Name: David Beck MRN: [id number] DOB: [birth year]    ADMISSION DATE:  10/29/2014 CONSULTATION DATE:  11/04/14  REFERRING MD :  Dr. Darvin Neighbours   CHIEF COMPLAINT:     Shortness of breath   HISTORY OF PRESENT ILLNESS    HPI - History per chart review and per patient  Patient is a 81 y.o. male PMHx of lymphoma, COPD, chronic respiratory failure on oxygen, and diabetes presents to the emergency room complaining of worsening shortness of breath along with desaturations on 4 L oxygen. He had an episode of dyspnea 2 days ago which self resolved. Yesterday noted to have shortness of breath and saturations in the low 80s on 4 L oxygen which prompted him to present to the emergency room. In the ER, patient has been found to have bibasilar atelectasis on the chest x-ray. With elevated white count and possibility of pneumonia, he was admitted to the hospital. No orthopnea. He does have chronic edema for which he is being treated with [place] boots. Admitting MD, noted that initially his saturations were 90% on a nonrebreather fluctuating and were dropping as low as 70. His ABG showed PO2 at 184. His pulse oximetry was thought to be inaccurate. Patient was placed on a nasal cannula at 6 L which caused his saturations to drop and worsening shortness of breath. Immediately respiratory therapist was called and patient placed on BiPAP which improved his saturations into the 90s. Stat CT scan of the chest was ordered which showed no pulmonary embolism but bibasilar pneumonia and possible worsening changes of lymphoma.  Pulmonary was consulted for further recommendations.   SUBJECTIVE: Doing well this morning, daughter at the bedside.  Tolerated Bipap overnight, now on 6L [place]      PAST MEDICAL HISTORY    :  Past Medical History  Diagnosis Date  . Diabetes mellitus without complication ([place])   . DJD (degenerative joint disease)   . BPH (benign prostatic hyperplasia)    . Hyperlipidemia   . History of hip replacement   . Pneumonia   . Melanoma ([place])   . Small cell B-cell lymphoma [facility])    Past Surgical History  Procedure Laterality Date  . No past surgeries    . Abdominal aortic aneurysm repair      4 years ago  . Joint replacement    . Peripheral vascular catheterization N/A 06/08/2014    Procedure: Glori Luis Cath Insertion;  Surgeon: Algernon Huxley, MD;  Location: [facility];  Service: Cardiovascular;  Laterality: N/A;   Prior to Admission medications   Medication Sig Start Date End Date Taking? Authorizing Provider  Alum & Mag Hydroxide-Simeth (MAGIC MOUTHWASH) SOLN Take 5 mLs by mouth 4 (four) times daily as needed for mouth pain. 07/26/14  Yes Lloyd Huger, MD  digoxin (LANOXIN) 0.125 MG tablet Take 1 tablet (0.125 mg total) by mouth daily. 08/13/14  Yes Bettey Costa, MD  docusate sodium (COLACE) 100 MG capsule Take 1 capsule (100 mg total) by mouth 2 (two) times daily. Patient taking differently: Take 200 mg by mouth 2 (two) times daily.  08/13/14  Yes Sital Mody, MD  glipiZIDE (GLUCOTROL) 10 MG tablet Take 5 mg by mouth 2 (two) times daily.   Yes Historical Provider, MD  lidocaine-prilocaine (EMLA) cream Apply 1 application topically as needed. Apply to port area 1-2 hours prior to chemotherapy treatment appointment, cover with plastic wrap. Patient taking differently: Apply 1 application topically as needed (prior  to accessing port). Apply to port area 1-2 hours prior to chemotherapy treatment appointment, cover with plastic wrap. 06/15/14  Yes Lloyd Huger, MD  megestrol (MEGACE) 40 MG/ML suspension Take 10 mLs (400 mg total) by mouth daily. 06/15/14  Yes Lloyd Huger, MD  Oxycodone HCl 10 MG TABS Take 1 tablet (10 mg total) by mouth every 6 (six) hours as needed. Patient taking differently: Take 10 mg by mouth every 6 (six) hours as needed (for pain).  07/14/14  Yes Lloyd Huger, MD  predniSONE (DELTASONE) 50 MG tablet Take 1  tablet daily for 5 days with each chemotherapy treatment Patient taking differently: Take 50 mg by mouth daily. Pt takes one tablet daily for 5 days with each chemotherapy treatment. 06/01/14  Yes Lloyd Huger, MD  prochlorperazine (COMPAZINE) 10 MG tablet Take 1 tablet (10 mg total) by mouth every 6 (six) hours as needed for nausea or vomiting. 06/22/14  Yes Lloyd Huger, MD  diltiazem (CARDIZEM CD) 180 MG 24 hr capsule Take 1 capsule (180 mg total) by mouth daily. Patient not taking: Reported on 11/12/2014 08/13/14   Bettey Costa, MD  feeding supplement, ENSURE ENLIVE, (ENSURE ENLIVE) LIQD Take 237 mLs by mouth 3 (three) times daily with meals. Patient not taking: Reported on 10/29/2014 08/13/14   Bettey Costa, MD   No Known Allergies   FAMILY HISTORY   Family History  Problem Relation Age of Onset  . Stomach cancer Mother   . Melanoma Brother   . Diabetes Brother   . CVA Brother       SOCIAL HISTORY    reports that he quit smoking about 36 years ago. His smoking use included Cigarettes. He does not have any smokeless tobacco history on file. He reports that he does not drink alcohol or use illicit drugs.  Review of Systems  Constitutional: Negative for fever, chills, weight loss and malaise/fatigue.  Eyes: Negative for blurred vision and double vision.  Respiratory: Positive for cough and shortness of breath.        Sob improving,  Mild fine crackles at the bases  Cardiovascular: Positive for palpitations.  Gastrointestinal: Negative for heartburn and nausea.  Genitourinary: Negative for dysuria and urgency.  Musculoskeletal: Negative for myalgias.  Skin: Negative for itching.  Neurological: Negative for dizziness and headaches.  Endo/Heme/Allergies: Does not bruise/bleed easily.  Psychiatric/Behavioral: Negative for depression.      VITAL SIGNS    Temp:  [97.5 F (36.4 C)-98.3 F (36.8 C)] 97.7 F (36.5 C) (10/23 0800) Pulse Rate:  [25-139] 128 (10/23  0900) Resp:  [9-40] 19 (10/23 0900) BP: (88-134)/(44-90) 94/67 mmHg (10/23 0900) SpO2:  [75 %-100 %] 92 % (10/23 0900) FiO2 (%):  [55 %-60 %] 60 % (10/23 0800) Weight:  [151 lb 7.3 oz (68.7 kg)] 151 lb 7.3 oz (68.7 kg) (10/23 0451) HEMODYNAMICS:   VENTILATOR SETTINGS: Vent Mode:  [-]  FiO2 (%):  [55 %-60 %] 60 % INTAKE / OUTPUT:  Intake/Output Summary (Last 24 hours) at 11/05/14 0959 Last data filed at 11/05/14 0900  Gross per 24 hour  Intake    385 ml  Output    700 ml  Net   -315 ml       PHYSICAL EXAM   Physical Exam  Constitutional: He is oriented to person, place, and time.  Cachetic appearance  HENT:  Head: Normocephalic and atraumatic.  Right Ear: External ear normal.  Left Ear: External ear normal.  Eyes: Pupils are equal,  round, and reactive to light.  Neck: Normal range of motion. Neck supple.  Cardiovascular: Normal rate, regular rhythm and normal heart sounds.   Pulmonary/Chest: Effort normal. No respiratory distress. He has no wheezes.  Dec BS at the bases.  Mild fine crackles at the bases  Abdominal: Soft. Bowel sounds are normal.  Musculoskeletal: Normal range of motion.  Neurological: He is alert and oriented to person, place, and time.  Skin: Skin is warm and dry.  Psychiatric: He has a normal mood and affect.  Nursing note and vitals reviewed.      LABS   LABS:  CBC  Recent Labs Lab 10/30/2014 1416 11/04/14 0501  WBC 14.1* 13.2*  HGB 9.5* 9.9*  HCT 30.2* 31.5*  PLT 174 194   Coag's No results for input(s): APTT, INR in the last 168 hours. BMET  Recent Labs Lab 10/29/2014 1416 11/04/14 0501  NA 139 140  K 3.8 4.2  CL 102 104  CO2 30 27  BUN 18 18  CREATININE 0.42* 0.63  GLUCOSE 132* 187*   Electrolytes  Recent Labs Lab 10/29/2014 1416 11/04/14 0501  CALCIUM 8.2* 8.3*   Sepsis Markers  Recent Labs Lab 11/02/2014 1519 11/02/2014 1837  LATICACIDVEN 0.9 1.8   ABG  Recent Labs Lab 10/31/2014 1350  PHART 7.45   PCO2ART 42  PO2ART 188*   Liver Enzymes No results for input(s): AST, ALT, ALKPHOS, BILITOT, ALBUMIN in the last 168 hours. Cardiac Enzymes  Recent Labs Lab 11/11/2014 1416  TROPONINI 0.03   Glucose  Recent Labs Lab 10/19/2014 1846 11/04/14 0715 11/04/14 1133 11/04/14 1653 11/04/14 2151 11/05/14 0746  GLUCAP 91 148* 89 163* 251* 226*     Recent Results (from the past 240 hour(s))  Blood culture (routine x 2)     Status: None (Preliminary result)   Collection Time: 10/22/2014  2:16 PM  Result Value Ref Range Status   Specimen Description BLOOD RIGHT PORTA CATH  Final   Special Requests BOTTLES DRAWN AEROBIC AND ANAEROBIC  5CC  Final   Culture NO GROWTH 2 DAYS  Final   Report Status PENDING  Incomplete  Blood culture (routine x 2)     Status: None (Preliminary result)   Collection Time: 10/24/2014  3:19 PM  Result Value Ref Range Status   Specimen Description BLOOD RIGHT AC  Final   Special Requests   Final    BOTTLES DRAWN AEROBIC AND ANAEROBIC  AER [place] ANA [place]   Culture NO GROWTH 2 DAYS  Final   Report Status PENDING  Incomplete  MRSA PCR Screening     Status: None   Collection Time: 10/29/2014  7:45 PM  Result Value Ref Range Status   MRSA by PCR NEGATIVE NEGATIVE Final    Comment:        The GeneXpert MRSA Assay (FDA approved for NASAL specimens only), is one component of a comprehensive MRSA colonization surveillance program. It is not intended to diagnose MRSA infection nor to guide or monitor treatment for MRSA infections.      Current facility-administered medications:  .  0.9 %  sodium chloride infusion, 250 mL, Intravenous, PRN, Hillary Bow, MD .  acetaminophen (TYLENOL) tablet 650 mg, 650 mg, Oral, Q6H PRN **OR** acetaminophen (TYLENOL) suppository 650 mg, 650 mg, Rectal, Q6H PRN, Hillary Bow, MD .  antiseptic oral rinse (CPC / CETYLPYRIDINIUM CHLORIDE 0.05%) solution 7 mL, 7 mL, Mouth Rinse, q12n4p, Srikar Sudini, MD, 7 mL at 11/04/14 1600 .   azithromycin (ZITHROMAX) 500  mg in dextrose 5 % 250 mL IVPB, 500 mg, Intravenous, Q24H, Srikar Sudini, MD, 500 mg at 11/04/14 2333 .  cefTRIAXone (ROCEPHIN) 1 g in dextrose 5 % 50 mL IVPB, 1 g, Intravenous, Q24H, Srikar Sudini, MD, Last Rate: 100 mL/hr at 11/04/14 1702, 1 g at 11/04/14 1702 .  chlorhexidine (PERIDEX) 0.12 % solution 15 mL, 15 mL, Mouth Rinse, BID, Srikar Sudini, MD, 15 mL at 11/04/14 2214 .  digoxin (LANOXIN) tablet 0.125 mg, 0.125 mg, Oral, Daily, Srikar Sudini, MD, 0.125 mg at 11/04/2014 2212 .  diltiazem (CARDIZEM CD) 24 hr capsule 180 mg, 180 mg, Oral, Daily, Hillary Bow, MD, Stopped at 10/18/2014 2211 .  docusate sodium (COLACE) capsule 200 mg, 200 mg, Oral, BID, Hillary Bow, MD, 200 mg at 11/04/14 2206 .  enoxaparin (LOVENOX) injection 40 mg, 40 mg, Subcutaneous, Q24H, Srikar Sudini, MD, 40 mg at 11/04/14 1705 .  glipiZIDE (GLUCOTROL) tablet 5 mg, 5 mg, Oral, BID, Hillary Bow, MD, 5 mg at 11/05/14 9458 .  insulin aspart (novoLOG) injection 0-5 Units, 0-5 Units, Subcutaneous, QHS, Hillary Bow, MD, 3 Units at 11/04/14 2206 .  insulin aspart (novoLOG) injection 0-9 Units, 0-9 Units, Subcutaneous, TID WC, Hillary Bow, MD, 3 Units at 11/05/14 [phone] .  ipratropium-albuterol (DUONEB) 0.5-2.5 (3) MG/3ML nebulizer solution 3 mL, 3 mL, Nebulization, Q4H, Srikar Sudini, MD, 3 mL at 11/05/14 0744 .  magic mouthwash, 5 mL, Oral, QID PRN, Hillary Bow, MD .  methylPREDNISolone sodium succinate (SOLU-MEDROL) 125 mg/2 mL injection 60 mg, 60 mg, Intravenous, Q24H, Srikar Sudini, MD, 60 mg at 11/04/14 1704 .  metoprolol (LOPRESSOR) injection 5 mg, 5 mg, Intravenous, Q4H PRN, Hillary Bow, MD, 5 mg at 11/04/14 2329 .  ondansetron (ZOFRAN) tablet 4 mg, 4 mg, Oral, Q6H PRN **OR** ondansetron (ZOFRAN) injection 4 mg, 4 mg, Intravenous, Q6H PRN, Srikar Sudini, MD .  oxyCODONE (Oxy IR/ROXICODONE) immediate release tablet 10 mg, 10 mg, Oral, Q6H PRN, Srikar Sudini, MD .  sodium chloride 0.9 %  injection 3 mL, 3 mL, Intravenous, Q12H, Srikar Sudini, MD, 3 mL at 11/04/14 2210 .  sodium chloride 0.9 % injection 3 mL, 3 mL, Intravenous, Q12H, Srikar Sudini, MD, 3 mL at 11/04/14 2211 .  sodium chloride 0.9 % injection 3 mL, 3 mL, Intravenous, PRN, Hillary Bow, MD  IMAGING    No results found.    Indwelling Urinary Catheter continued, requirement due to   Reason to continue Indwelling Urinary Catheter for strict Intake/Output monitoring for hemodynamic instability   Central Line continued, requirement due to   Reason to continue [employer] Monitoring of central venous pressure or other hemodynamic parameters   Ventilator continued, requirement due to, resp failure    Ventilator Sedation RASS 0 to -2   Lines:  Cultures: Bld Cx x 2 10/21>>  Antibiotics: Azithro 10/21>> Rocephin 10/21>>  ASSESSMENT/PLAN  81 yo male with PMHx of lymphoma, COPD (O2 Dependent), DM, presenting with dyspnea, found to have pneumonia on Chest CT.   Acute on chronic resp failure -improving Hypoxia - improving Bibasilar pneumonia vs atelectasis Pulmonary lymphadenopathy Plan: -Cont with IV antibiotics (rocephin and azithro), given that he is immunocompromised may need to expand coverage to HCAP if clinical condition worsens; otherwise, can transition to PO Azithro and Augmentin x 7 days total -Recently started on Rituxan - possible pneumonia, pneumonitis, or respiratory tract infection -Cont with incentive spirometry and bipap -Cont with scheduled nebs -May use solumedrol for the next 24 hrs then wean -follow up blood and sputum  cultures -Bipap 2 hrs in the AM and PM, min 4 hours with sleep -maintain O2 sats >88% -can transfer to stepdown status or floor - Primary Pulmonologist - Dr. Raul Del - Please arrange outpatient hospital follow up with him.  * Afib with RVR - chronic, and difficult to control (follow with [place], Dr. Nehemiah Massed) - has a known history - cont with home meds -  Afib with RVR most likely due to underlying condition - cont with cardiac monitoring.   * Lymphoma - Follows at [facility]. - poor prognosis given age and current stage of lymphoma - per Dr. Grayland Ormond last note  * DM - SSI - Diabetic diet.  *Social - updated daughter  at bedside - DNR   Thank you for consulting [facility], we will signoff at this time.  Please feel free to contacts [place] with any questions.    I have personally obtained a history, examined the patient, evaluated laboratory and imaging results, formulated the assessment and plan and placed orders.  The Patient requires high complexity decision making for assessment and support, frequent evaluation and titration of therapies, application of advanced monitoring technologies and extensive interpretation of multiple databases. Critical Care Time devoted to patient care services described in this note is 45 minutes.   Pulmonary consult time - 35 mins   Vilinda Boehringer, MD [facility] Pager [phone] (please enter 7-digits) On Call Pager - [phone] (please enter 7-digits)     11/05/2014, 9:59 AM

## 2014-11-05 NOTE — Consult Note (Signed)
[name]  Patient Care Team: Dion Body, MD as PCP - General (Family Medicine) Algernon Huxley, MD as Consulting Physician (Vascular Surgery)  CHIEF COMPLAINTS/PURPOSE OF CONSULTATION:  Diffuse large B-cell lymphoma/question interstitial lung disease from medication.  Please note that given his age/patient is a vague historian. Is currently accompanied by his daughter.  HISTORY OF PRESENTING ILLNESS:  David Beck 81 y.o.  male Caucasian male patient with a history of diffuse large B-cell lymphoma stage IV diagnosed in April/May 2016 who currently follows up with Dr. Grayland Ormond; is currently status post rituximab -CVP 6 [last treatment approximately 2 weeks ago].    Approximately 3-4 days ago was noted patient was getting progressively short of breath and worsening cough. Hence admitted to the hospital. CT of the chest- showed bilateral diffuse interstitial lung changes. Given the concerns of drug-induced pneumonitis/ atypical infection - patient has been treated with steroids and IV antibiotics.   Patient was initially been on BiPAP; is currently on 4-5 L of oxygen saturating between 90-95%. He feels much better since being the hospital.   He also complains of swelling in the legs/weeping few days after chemotherapy.  ROS: A complete 10 point review of system is done which is negative except mentioned above in history of present illness  MEDICAL HISTORY:  Past Medical History  Diagnosis Date  . Diabetes mellitus without complication ([place])   . DJD (degenerative joint disease)   . BPH (benign prostatic hyperplasia)   . Hyperlipidemia   . History of hip replacement   . Pneumonia   . Melanoma ([place])   . 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 No past surgeries    . Abdominal aortic aneurysm repair      4 years ago  . Joint replacement    . Peripheral vascular catheterization  N/A 06/08/2014    Procedure: Glori Luis Cath Insertion;  Surgeon: Algernon Huxley, MD;  Location: [facility];  Service: Cardiovascular;  Laterality: N/A;    SOCIAL HISTORY: Social History   Social History  . Marital Status: Married    Spouse Name: N/A  . Number of Children: N/A  . Years of Education: N/A   Occupational History  . Not on file.   Social History Main Topics  . Smoking status: Former Smoker    Types: Cigarettes    Quit date: 01/14/1978  . Smokeless tobacco: Not on file  . Alcohol Use: No  . Drug Use: No  . Sexual Activity: Not on file   Other Topics Concern  . Not on file   Social History Narrative    FAMILY HISTORY: Family History  Problem Relation Age of Onset  . Stomach cancer Mother   . Melanoma Brother   . Diabetes Brother   . CVA Brother     ALLERGIES:  has No Known Allergies.  MEDICATIONS:  Current Facility-Administered Medications  Medication Dose Route Frequency Provider Last Rate Last Dose  . 0.9 %  sodium chloride infusion  250 mL Intravenous PRN Hillary Bow, MD      . acetaminophen (TYLENOL) tablet 650 mg  650 mg Oral Q6H PRN Hillary Bow, MD       Or  . acetaminophen (TYLENOL) suppository 650 mg  650 mg Rectal Q6H PRN Srikar Sudini, MD      . antiseptic oral rinse (CPC / CETYLPYRIDINIUM CHLORIDE 0.05%) solution 7 mL  7 mL Mouth  Rinse q12n4p Hillary Bow, MD   7 mL at 11/04/14 1600  . azithromycin (ZITHROMAX) 500 mg in dextrose 5 % 250 mL IVPB  500 mg Intravenous Q24H Hillary Bow, MD   500 mg at 11/04/14 2333  . cefTRIAXone (ROCEPHIN) 1 g in dextrose 5 % 50 mL IVPB  1 g Intravenous Q24H Srikar Sudini, MD 100 mL/hr at 11/04/14 1702 1 g at 11/04/14 1702  . chlorhexidine (PERIDEX) 0.12 % solution 15 mL  15 mL Mouth Rinse BID Hillary Bow, MD   15 mL at 11/05/14 1016  . digoxin (LANOXIN) tablet 0.125 mg  0.125 mg Oral Daily Srikar Sudini, MD   0.125 mg at 11/05/14 1015  . diltiazem (CARDIZEM CD) 24 hr capsule 180 mg  180 mg Oral Daily  Hillary Bow, MD   180 mg at 11/05/14 1015  . docusate sodium (COLACE) capsule 200 mg  200 mg Oral BID Hillary Bow, MD   200 mg at 11/05/14 1014  . enoxaparin (LOVENOX) injection 40 mg  40 mg Subcutaneous Q24H Hillary Bow, MD   40 mg at 11/04/14 1705  . glipiZIDE (GLUCOTROL) tablet 5 mg  5 mg Oral BID Hillary Bow, MD   5 mg at 11/05/14 6967  . insulin aspart (novoLOG) injection 0-5 Units  0-5 Units Subcutaneous QHS Hillary Bow, MD   3 Units at 11/04/14 2206  . insulin aspart (novoLOG) injection 0-9 Units  0-9 Units Subcutaneous TID WC Hillary Bow, MD   3 Units at 11/05/14 0834  . ipratropium-albuterol (DUONEB) 0.5-2.5 (3) MG/3ML nebulizer solution 3 mL  3 mL Nebulization Q4H Srikar Sudini, MD   3 mL at 11/05/14 0744  . magic mouthwash  5 mL Oral QID PRN Hillary Bow, MD      . methylPREDNISolone sodium succinate (SOLU-MEDROL) 125 mg/2 mL injection 60 mg  60 mg Intravenous Q24H Hillary Bow, MD   60 mg at 11/04/14 1704  . metoprolol (LOPRESSOR) injection 5 mg  5 mg Intravenous Q4H PRN Hillary Bow, MD   5 mg at 11/04/14 2329  . ondansetron (ZOFRAN) tablet 4 mg  4 mg Oral Q6H PRN Hillary Bow, MD       Or  . ondansetron (ZOFRAN) injection 4 mg  4 mg Intravenous Q6H PRN Srikar Sudini, MD      . oxyCODONE (Oxy IR/ROXICODONE) immediate release tablet 10 mg  10 mg Oral Q6H PRN Srikar Sudini, MD      . sodium chloride 0.9 % injection 3 mL  3 mL Intravenous Q12H Srikar Sudini, MD   3 mL at 11/05/14 1000  . sodium chloride 0.9 % injection 3 mL  3 mL Intravenous Q12H Srikar Sudini, MD   3 mL at 11/05/14 1000  . sodium chloride 0.9 % injection 3 mL  3 mL Intravenous PRN Srikar Sudini, MD          .  PHYSICAL EXAMINATION:  Filed Vitals:   11/05/14 1014  BP:   Pulse: 104  Temp:   Resp:    Filed Weights   10/30/2014 1303 11/04/14 0402 11/05/14 0451  Weight: 139 lb (63.05 kg) 151 lb 14.4 oz (68.9 kg) 151 lb 7.3 oz (68.7 kg)    GENERAL: frail-appearing;  well-developed; Alert, no  distress and comfortable; on 4-5 L of oxygen.  Accompanied by his daughter. EYES: no pallor or icterus OROPHARYNX: no thrush or ulceration;poor dentition. NECK: supple, no masses felt LYMPH:  no palpable lymphadenopathy in the cervical, axillary or inguinal regions LUNGS: Bilateral crackles noted especially  in the bases. No wheezing  HEART/CVS: regular rate & irregular  rhythm and no murmurs; bilateral lower extremity edema left more than right.  ABDOMEN: abdomen soft, non-tender and normal bowel sounds Musculoskeletal:no cyanosis of digits and no clubbing  PSYCH: alert & oriented x 3 with fluent speech NEURO: no focal motor/sensory deficits SKIN:  no rashes or significant lesions  LABORATORY DATA:  I have reviewed the data as listed Lab Results  Component Value Date   WBC 13.2* 11/04/2014   HGB 9.9* 11/04/2014   HCT 31.5* 11/04/2014   MCV 87.9 11/04/2014   PLT 194 11/04/2014    Recent Labs  08/24/14 0921 09/21/14 0939 10/19/14 0947 10/15/2014 1416 11/04/14 0501  NA 137 133* 133* 139 140  K 4.4 3.5 4.0 3.8 4.2  CL 103 97* 100* 102 104  CO2 31 32 29 30 27   GLUCOSE 176* 110* 187* 132* 187*  BUN 15 13 12 18 18   CREATININE 0.64 0.50* 0.55* 0.42* 0.63  CALCIUM 7.6* 7.5* 7.6* 8.2* 8.3*  GFRNONAA >60 >60 >60 >60 >60  GFRAA >60 >60 >60 >60 >60  PROT 5.4* 5.5* 5.3*  --   --   ALBUMIN 3.1* 2.8* 2.7*  --   --   AST 17 19 19   --   --   ALT 15* 14* 12*  --   --   ALKPHOS 63 85 76  --   --   BILITOT 0.6 0.4 0.4  --   --     RADIOGRAPHIC STUDIES: I have personally reviewed the radiological images as listed and agreed with the findings in the report. Ct Angio Chest Pe W/cm &/or Wo Cm  10/18/2014  CLINICAL DATA:  Increased shortness of breath and weakness 1 week. Hypoxia. Diffuse large B-cell lymphoma. EXAM: CT ANGIOGRAPHY CHEST WITH CONTRAST TECHNIQUE: Multidetector CT imaging of the chest was performed using the standard protocol during bolus administration of intravenous  contrast. Multiplanar CT image reconstructions and MIPs were obtained to evaluate the vascular anatomy. CONTRAST:  26mL OMNIPAQUE IOHEXOL 350 MG/ML SOLN COMPARISON:  08/09/2014 FINDINGS: Right IJ centri is catheter has tip over the cavoatrial junction. Small to moderate bilateral pleural effusions are present right greater than left which are worse. Minimal loculation of fluid over the lateral left mid to lower lung. There is consolidation over the lower lobes likely atelectasis although cannot exclude infection. There is progressive diffuse bilateral interstitial prominence with patchy areas of mixed interstitial airspace opacification. Small web over the right mainstem bronchus. Heart is normal in size. There is mild calcified plaque over the left main and 3 vessel coronary arteries. Pulmonary arterial system is well opacified as there is no evidence of pulmonary emboli. 1.1 cm precarinal lymph node. Two adjacent 1.1 cm prevascular lymph nodes. 1.6 cm precarinal lymph node 1.3 cm subcarinal lymph node. Adenopathy overall worse compared to the prior exam. No significant hilar or axillary adenopathy. Remaining mediastinal structures are unchanged. Images through the upper abdomen are unchanged. There are moderate degenerative changes of the spine. Review of the MIP images confirms the above findings. IMPRESSION: No evidence pulmonary embolism. Interval progression of diffuse interstitial disease with minimal patchy mixed interstitial airspace density. Findings may be due to infection versus progression of patient's lymphoma or treatment related changes. Mild worsening of mediastinal adenopathy which may be reactive versus lymphomatous disease. Worsening small to moderate bilateral pleural effusions right greater than left with minimal loculation over the lateral left mid to lower lung. Associated consolidation in the lower lobes  likely atelectasis although cannot exclude infection. Atherosclerotic coronary artery  disease. Electronically Signed   By: Marin Olp M.D.   On: 10/29/2014 17:25   Dg Chest Portable 1 View  10/21/2014  CLINICAL DATA:  Increased weakness and difficulty breathing for 1 week EXAM: PORTABLE CHEST - 1 VIEW COMPARISON:  08/12/2014 FINDINGS: Cardiac shadow remains enlarged. A right-sided chest wall port is again seen. Stable left-sided pleural effusion is noted. Diffuse interstitial changes are again noted. Persistent bibasilar airspace disease is seen. IMPRESSION: No significant interval change from the prior exam. Electronically Signed   By: Inez Catalina M.D.   On: 10/26/2014 14:17    ASSESSMENT & PLAN:  81 year old Caucasian male patient with history of diffuse large B-cell lymphoma stage IV status post 6 cycles of Rituxan-CVP currently admitted to the hospital for worsening shortness of breath/cough  # Respiratory failure- likely secondary to acute interstitial pneumonitis. Etiology- Rituxan versus Cytoxan versus others. On antibiotics/steroids improving. Clinically this is less likely to be from progressive lymphoma itself  # Diffuse large B cell lymphoma status post 6 cycles of RCVP.   Recommendations:  # Agreed with steroids/antibiotics; however recommend tapering Solu-Medrol to 30 mg IV once a day; and then transition to by mouth prednisone. The duration of the steroids would basically be dictated by response to therapy/treatment of presumed distal pneumonitis from chemotherapy/rituximab. Recommend checking BNP to rule out any fluid overload.   # Patient will need a follow-up PET scan as planned outpatient.    I discussed the above plan at length with the patient/family-daughter and wife at the bedside in detail. Also discussed with Dr. Stevenson Clinch.   Thank you Dr. Posey Pronto for allowing me to participate in the care of your pleasant patient.  I spent 30 minutes counseling the patient face to face. The total time spent in the appointment was 60 minutes and more than 50% was on  counseling.     Cammie Sickle, MD 11/05/2014 10:23 AM

## 2014-11-05 NOTE — Evaluation (Signed)
Physical Therapy Evaluation Patient Details Name: David Beck MRN: [id number] DOB: 12-14-1920 Today's Date: 11/05/2014   History of Present Illness  David Beck is a 81 y.o. male with a known history of DM, lymphoma here with dizziness. Last chemo on Thursday. Patient has been feeling dizzy on standing. Fluids given in the emergency room hoping it would improve his tachycardia and dizziness. The patient continues to be dizzy. Heartrate is as high as 160 in atrial flutter. No cardiac history.  Clinical Impression  Pt is on 5 liters O2 (4 liters at home) but with even light exercises his O2 drops to the mid 80s and after standing it even drops to the high 70s and he is fatigued.  Also his heart rate was inconsistent but below 100 during history taking, but with light exercises and during standing it increases to 130-140.  Pt shows good effort and is eager to work with PT but is still limited and generally weak.     Follow Up Recommendations SNF    Equipment Recommendations       Recommendations for Other Services       Precautions / Restrictions Precautions Precautions: Fall Restrictions Weight Bearing Restrictions: No      Mobility  Bed Mobility Overal bed mobility: Needs Assistance Bed Mobility: Supine to Sit;Sit to Supine     Supine to sit: Min assist Sit to supine: Mod assist   General bed mobility comments: Pt is slow and labored with bed mobility, he is fatigued with the effort and needs to sit at EOB to recover  Transfers Overall transfer level: Needs assistance Equipment used: Rolling walker (2 wheeled) Transfers: Sit to/from Stand Sit to Stand: Min assist         General transfer comment: Pt is not confident with standing and has signficant drop in O2 and increased HR with the effort.   Ambulation/Gait             General Gait Details: did not do any ambulation, pt was able to take a few small side shuffle steps at EOB  Stairs             Wheelchair Mobility    Modified Rankin (Stroke Patients Only)       Balance                                             Pertinent Vitals/Pain Pain Assessment: No/denies pain    Home Living Family/patient expects to be discharged to:: Skilled nursing facility                      Prior Function Level of Independence: Independent with assistive device(s)         Comments: Pt uses RW at home, recently has been feeling weaker and uses a w/c outside the home     Hand Dominance        Extremity/Trunk Assessment   Upper Extremity Assessment: Generalized weakness (grossly 4-/5 b/l )           Lower Extremity Assessment: Generalized weakness (R LE grossly 4-/5, L grossly 3+/5)         Communication   Communication: HOH  Cognition Arousal/Alertness: Awake/alert Behavior During Therapy: WFL for tasks assessed/performed Overall Cognitive Status: Within Functional Limits for tasks assessed  General Comments      Exercises General Exercises - Lower Extremity Ankle Circles/Pumps: AROM;10 reps Quad Sets: AROM;10 reps Heel Slides: AROM;10 reps Hip ABduction/ADduction: AROM;10 reps      Assessment/Plan    PT Assessment Patient needs continued PT services  PT Diagnosis Difficulty walking;Generalized weakness   PT Problem List Decreased strength;Decreased activity tolerance;Decreased balance;Decreased mobility;Decreased coordination;Decreased knowledge of use of DME;Decreased safety awareness  PT Treatment Interventions Gait training;DME instruction;Functional mobility training;Therapeutic activities;Therapeutic exercise   PT Goals (Current goals can be found in the Care Plan section) Acute Rehab PT Goals Patient Stated Goal: "I need to get stronger" PT Goal Formulation: With patient Time For Goal Achievement: 11/19/14    Frequency Min 2X/week   Barriers to discharge        Co-evaluation                End of Session Equipment Utilized During Treatment: Gait belt Activity Tolerance: Patient limited by fatigue (O2 (on 5 liters) drops to high 70s and HR 140s with the effo) Patient left: in bed;with nursing/sitter in room Nurse Communication: Mobility status (change in vitals)         Time: 4098-1191 PT Time Calculation (min) (ACUTE ONLY): 32 min   Charges:   PT Evaluation $Initial PT Evaluation Tier I: 1 Procedure PT Treatments $Therapeutic Exercise: 8-22 mins   PT G Codes:       Wayne Both, PT, DPT [phone]  Kreg Shropshire 11/05/2014, 2:26 PM

## 2014-11-05 NOTE — Progress Notes (Signed)
[facility] [place] NAME: David Beck    MR#:  [id number]  DATE OF BIRTH:  05/18/1920  SUBJECTIVE:   Feels a lot better. Had good nite sleep. dter in the room  REVIEW OF SYSTEMS:   Review of Systems  Constitutional: Negative for fever, chills and weight loss.  HENT: Negative for ear discharge, ear pain and nosebleeds.   Eyes: Negative for blurred vision, pain and discharge.  Respiratory: Positive for cough and shortness of breath. Negative for sputum production, wheezing and stridor.   Cardiovascular: Negative for chest pain, palpitations, orthopnea and PND.  Gastrointestinal: Negative for nausea, vomiting, abdominal pain and diarrhea.  Genitourinary: Negative for urgency and frequency.  Musculoskeletal: Negative for back pain and joint pain.  Neurological: Positive for weakness. Negative for sensory change, speech change and focal weakness.  Psychiatric/Behavioral: Negative for depression and hallucinations. The patient is not nervous/anxious.    Tolerating Diet:yes Tolerating PT: not yet  DRUG ALLERGIES:  No Known Allergies  VITALS:  Blood pressure 99/56, pulse 101, temperature 97.7 F (36.5 C), temperature source Oral, resp. rate 26, height 5\' 10"  (1.778 m), weight 68.7 kg (151 lb 7.3 oz), SpO2 89 %.  PHYSICAL EXAMINATION:   Physical Exam  GENERAL:  81 y.o.-year-old patient lying in the bed with no acute distress. Thin,cachectic, critically ill appearing EYES: Pupils equal, round, reactive to light and accommodation. No scleral icterus. Extraocular muscles intact.  HEENT: Head atraumatic, normocephalic. Oropharynx and nasopharynx clear.  NECK:  Supple, no jugular venous distention. No thyroid enlargement, no tenderness.  LUNGS: distant breath sounds bilaterally, no wheezing, ++rales, norhonchi. No use of accessory muscles of respiration.  CARDIOVASCULAR: S1, S2 normal. No murmurs, rubs, or gallops.  ABDOMEN: Soft,  nontender, nondistended. Bowel sounds present. No organomegaly or mass.  EXTREMITIES: No cyanosis, clubbing or edema b/l.    NEUROLOGIC: Cranial nerves II through XII are intact. No focal Motor or sensory deficits b/l.   PSYCHIATRIC: The patient is alert and oriented x 3.  SKIN: No obvious rash, lesion, or ulcer.    LABORATORY PANEL:   CBC  Recent Labs Lab 11/04/14 0501  WBC 13.2*  HGB 9.9*  HCT 31.5*  PLT 194    Chemistries   Recent Labs Lab 11/04/14 0501  NA 140  K 4.2  CL 104  CO2 27  GLUCOSE 187*  BUN 18  CREATININE 0.63  CALCIUM 8.3*    Cardiac Enzymes  Recent Labs Lab 10/30/2014 1416  TROPONINI 0.03    RADIOLOGY:  Ct Angio Chest Pe W/cm &/or Wo Cm  11/13/2014  CLINICAL DATA:  Increased shortness of breath and weakness 1 week. Hypoxia. Diffuse large B-cell lymphoma. EXAM: CT ANGIOGRAPHY CHEST WITH CONTRAST TECHNIQUE: Multidetector CT imaging of the chest was performed using the standard protocol during bolus administration of intravenous contrast. Multiplanar CT image reconstructions and MIPs were obtained to evaluate the vascular anatomy. CONTRAST:  74mL OMNIPAQUE IOHEXOL 350 MG/ML SOLN COMPARISON:  08/09/2014 FINDINGS: Right IJ centri is catheter has tip over the cavoatrial junction. Small to moderate bilateral pleural effusions are present right greater than left which are worse. Minimal loculation of fluid over the lateral left mid to lower lung. There is consolidation over the lower lobes likely atelectasis although cannot exclude infection. There is progressive diffuse bilateral interstitial prominence with patchy areas of mixed interstitial airspace opacification. Small web over the right mainstem bronchus. Heart is normal in size. There is mild calcified plaque over the left  main and 3 vessel coronary arteries. Pulmonary arterial system is well opacified as there is no evidence of pulmonary emboli. 1.1 cm precarinal lymph node. Two adjacent 1.1 cm prevascular  lymph nodes. 1.6 cm precarinal lymph node 1.3 cm subcarinal lymph node. Adenopathy overall worse compared to the prior exam. No significant hilar or axillary adenopathy. Remaining mediastinal structures are unchanged. Images through the upper abdomen are unchanged. There are moderate degenerative changes of the spine. Review of the MIP images confirms the above findings. IMPRESSION: No evidence pulmonary embolism. Interval progression of diffuse interstitial disease with minimal patchy mixed interstitial airspace density. Findings may be due to infection versus progression of patient's lymphoma or treatment related changes. Mild worsening of mediastinal adenopathy which may be reactive versus lymphomatous disease. Worsening small to moderate bilateral pleural effusions right greater than left with minimal loculation over the lateral left mid to lower lung. Associated consolidation in the lower lobes likely atelectasis although cannot exclude infection. Atherosclerotic coronary artery disease. Electronically Signed   By: Marin Olp M.D.   On: 11/11/2014 17:25   Dg Chest Portable 1 View  10/29/2014  CLINICAL DATA:  Increased weakness and difficulty breathing for 1 week EXAM: PORTABLE CHEST - 1 VIEW COMPARISON:  08/12/2014 FINDINGS: Cardiac shadow remains enlarged. A right-sided chest wall port is again seen. Stable left-sided pleural effusion is noted. Diffuse interstitial changes are again noted. Persistent bibasilar airspace disease is seen. IMPRESSION: No significant interval change from the prior exam. Electronically Signed   By: Inez Catalina M.D.   On: 11/09/2014 14:17     ASSESSMENT AND PLAN:   * Acute on chronic resp failure, hypoxic with Bibasilar pneumonia Bibasilar pneumonia. No pulmonary embolism on CT scan of the chest. Start patient on BiPAP. He quickly desats on nasal cannula and saturations are only 80-85% on a nonrebreather. IV antibiotics. Wean oxygen as tolerated. Scheduled nebulizer  therapy. Consulted pulmonology. IV Solu-Medrol.  * Afib with RVR Continue home medications. Rapid ventricular rate transient due to pneumonia. Monitor on telemetry.  * Lymphoma Follows at the cancer center. CT scan of the chest shows changes which could be from lymphoma.  -appreciated oncology input  * DM Sliding scale insulin. Diabetic diet.  * DVT prophylaxis with Lovenox.  *protein cal malnutrition,severe dietitian to see  Case discussed with Care Management/Social Worker. Management plans discussed with the patient, family and they are in agreement.  CODE STATUS: dnr  TOTAL criticalING CARE OF THIS PATIENT: 35inutes.  >50% time spent on counselling and coordination of care pt and family  POSSIBLE D/C IN ?DAYS, DEPENDING ON CLINICAL CONDITION.   David Beck M.D on 11/05/2014 at 1:58 PM  Between 7am to 6pm - Pager - [phone]  After 6pm go to [email or web address] - password [email or web address] [facility]  Office  [phone]  CC: Primary care physician; Dion Body, MD

## 2014-11-06 ENCOUNTER — Inpatient Hospital Stay: Payer: Medicare Other

## 2014-11-06 DIAGNOSIS — Z87891 Personal history of nicotine dependence: Secondary | ICD-10-CM

## 2014-11-06 DIAGNOSIS — J9600 Acute respiratory failure, unspecified whether with hypoxia or hypercapnia: Secondary | ICD-10-CM

## 2014-11-06 DIAGNOSIS — R5383 Other fatigue: Secondary | ICD-10-CM

## 2014-11-06 DIAGNOSIS — R5381 Other malaise: Secondary | ICD-10-CM

## 2014-11-06 DIAGNOSIS — E119 Type 2 diabetes mellitus without complications: Secondary | ICD-10-CM

## 2014-11-06 DIAGNOSIS — Z9221 Personal history of antineoplastic chemotherapy: Secondary | ICD-10-CM

## 2014-11-06 DIAGNOSIS — R531 Weakness: Secondary | ICD-10-CM

## 2014-11-06 DIAGNOSIS — R634 Abnormal weight loss: Secondary | ICD-10-CM

## 2014-11-06 DIAGNOSIS — J849 Interstitial pulmonary disease, unspecified: Secondary | ICD-10-CM

## 2014-11-06 DIAGNOSIS — Z85820 Personal history of malignant melanoma of skin: Secondary | ICD-10-CM

## 2014-11-06 DIAGNOSIS — C8330 Diffuse large B-cell lymphoma, unspecified site: Secondary | ICD-10-CM

## 2014-11-06 DIAGNOSIS — Z794 Long term (current) use of insulin: Secondary | ICD-10-CM

## 2014-11-06 LAB — GLUCOSE, CAPILLARY
GLUCOSE-CAPILLARY: 109 mg/dL — AB (ref 65–99)
Glucose-Capillary: 177 mg/dL — ABNORMAL HIGH (ref 65–99)
Glucose-Capillary: 176 mg/dL — ABNORMAL HIGH (ref 65–99)
Glucose-Capillary: 103 mg/dL — ABNORMAL HIGH (ref 65–99)

## 2014-11-06 MED ORDER — DEXTROSE 5 % IV SOLN
500.0000 mg | INTRAVENOUS | Status: DC
  Filled 2014-11-06: qty 500

## 2014-11-06 MED ORDER — ENOXAPARIN SODIUM 40 MG/0.4ML ~~LOC~~ SOLN
40.0000 mg | SUBCUTANEOUS | Status: DC
  Administered 2014-11-06: 40 mg via SUBCUTANEOUS
  Filled 2014-11-06: qty 0.4

## 2014-11-06 MED ORDER — DEXTROSE 5 % IV SOLN
1.0000 g | INTRAVENOUS | Status: DC
  Filled 2014-11-06: qty 10

## 2014-11-06 MED ORDER — PIPERACILLIN-TAZOBACTAM 3.375 G IVPB
3.3750 g | Freq: Three times a day (TID) | INTRAVENOUS | Status: DC
  Administered 2014-11-06 (×2): 3.375 g via INTRAVENOUS
  Filled 2014-11-06 (×5): qty 50

## 2014-11-07 LAB — GLUCOSE, CAPILLARY: Glucose-Capillary: 91 mg/dL (ref 65–99)

## 2014-11-07 NOTE — Progress Notes (Signed)
Gave pastoral & spiritual support, prescience and prayer.  Close knit family. [place] [phone]

## 2014-11-07 NOTE — Progress Notes (Signed)
Telemetry clerk notified me that patient heart rate in 20s. RN Crystal B went to check on patient. Pt discolored and mouth open. Heart rate continued to decrease until asystole. Md notified. Two RNs pronounced. Family notified. See flowsheet regarding all other information.

## 2014-11-09 LAB — CULTURE, BLOOD (ROUTINE X 2)
CULTURE: NO GROWTH
CULTURE: NO GROWTH

## 2014-11-14 NOTE — Progress Notes (Signed)
Patient transferred to telemetry room 250 via bed. Report given to [facility]. Patient personal belongings bag as well as a hot pink walker and oxygen tank taken to unit as well. Vincente Liberty stated that she would label patient's belongings with a sticker.

## 2014-11-14 NOTE — Progress Notes (Signed)
Transported pt to room 250 on NRB mask and placed pt back on HFNC once in room 75%, 45lpm SPO2 91%.

## 2014-11-14 NOTE — Progress Notes (Signed)
Patient awoke most of shift; frequent reorientation needed due to periods of confusion. Bed low and bed alarm on. Patient attempted to get out of bed several times during the night. Patient kept high flow nasal cannula on during the night. Patient was tachycardic and had low oxygen saturation during shift when engaging in conversation. Denies pain. Will continue to monitor.

## 2014-11-14 NOTE — Progress Notes (Signed)
[facility] [place] NAME: David Beck    MR#:  [id number]  DATE OF BIRTH:  09/23/1920  SUBJECTIVE:  CHIEF COMPLAINT:  Patient is on high flow oxygen for respiratory distress. He was  confused this a.m. Choking spells were noticed with food. Patient was made and by mouth. Wife and son at bedside  REVIEW OF SYSTEMS:  Review of systems unobtainable as patient is lethargic  DRUG ALLERGIES:  No Known Allergies  VITALS:  Blood pressure 115/71, pulse 69, temperature 97.9 F (36.6 C), temperature source Axillary, resp. rate 17, height 5\' 10"  (1.778 m), weight 68.5 kg (151 lb 0.2 oz), SpO2 89 %.  PHYSICAL EXAMINATION:  GENERAL:  81 y.o.-year-old patient lying in the bed with no acute distress.  EYES: Pupils equal, round, reactive to light and accommodation. No scleral icterus.  HEENT: Head atraumatic, normocephalic. Oropharynx and nasopharynx clear.  NECK:  Supple, no jugular venous distention. No thyroid enlargement, no tenderness.  LUNGS: Coarse breath sounds with moderate air entry bilaterally, no wheezing, few rales,rhonchi . No use of accessory muscles of respiration.  CARDIOVASCULAR: S1, S2 normal. No murmurs, rubs, or gallops.  ABDOMEN: Soft, nontender, nondistended. Bowel sounds present. No organomegaly or mass.  EXTREMITIES: No pedal edema, cyanosis, or clubbing.  NEUROLOGIC: Patient is pleasantly confused  Psychiatry: Patient is pleasantly confused        Recent Labs Lab 11/04/14 0501  WBC 13.2*  HGB 9.9*  HCT 31.5*  PLT 194   ------------------------------------------------------------------------------------------------------------------  Chemistries   Recent Labs Lab 11/04/14 0501  NA 140  K 4.2  CL 104  CO2 27  GLUCOSE 187*  BUN 18  CREATININE 0.63  CALCIUM 8.3*   ------------------------------------------------------------------------------------------------------------------  Cardiac  Enzymes  Recent Labs Lab 11/08/2014 1416  TROPONINI 0.03   ------------------------------------------------------------------------------------------------------------------  RADIOLOGY:  No results found.  EKG:   Orders placed or performed during the hospital encounter of 11/02/2014  . ED EKG  . ED EKG  . EKG 12-Lead  . EKG 12-Lead    ASSESSMENT AND PLAN:   * Acute on chronic resp failure, hypoxic with Bibasilar pneumonia/probably with superimposed aspiration pneumonia Swallow evaluation is positive for aspiration per ST Keep the patient nothing by mouth   No pulmonary embolism on CT scan of the chest.  patient is currently on high flow oxygen .   IV Zosyn is added to the regimen. Wean oxygen as tolerated. Scheduled nebulizer therapy. is appreciated pulmonology recommendations Continue  IV Solu-Medrol.   * Afib with RVR Continue home medications. Rapid ventricular rate transient due to pneumonia. Monitor on telemetry.  * Lymphoma Follows at the cancer center. CT scan of the chest shows changes which could be from lymphoma.  -appreciated oncology input  * DM Sliding scale insulin. Diabetic diet.  * DVT prophylaxis with Lovenox.  *protein cal malnutrition,severe dietitian to see   had a lengthy discussion with the patient's wife and son at bedside regarding patient's current situation and comorbidities   patient being aspirating have given the options of palliative care versus PEG tube placement which might not be a great option in his case. Palliative care consult is placed. Patient was seen by [facility] RN Mrs. Santiago Glad before and they would like to talk to her before making further  decisions   All the records are reviewed and case discussed with Care Management/Social Workerr. Management plans discussed with the patient, family and they are in agreement.  CODE STATUS: DNR  TOTAL  CRITICAL CARE TIME TAKING CARE OF THIS PATIENT: 35 minutes.   POSSIBLE D/C IN ?  DAYS, DEPENDING ON CLINICAL CONDITION.   Nicholes Mango M.D on Nov 23, 2014 at 1:18 PM  Between 7am to 6pm - Pager - [phone] After 6pm go to [email or web address] - password [email or web address] [email or web address]  Office  [phone]  CC: Primary care physician; Dion Body, MD

## 2014-11-14 NOTE — Discharge Summary (Signed)
[facility] [place] NAME: David Beck    MR#:  [id number]  DATE OF BIRTH:  17-Jun-1920  DATE OF ADMISSION:  10/22/2014 ADMITTING PHYSICIAN: David Bow, MD  DATE OF DISCHARGE: Nov 20, 2014 11:15 PM  PRIMARY CARE PHYSICIAN: David Body, MD    ADMISSION DIAGNOSIS:  Shortness of breath [R06.02] Hypoxia [R09.02]  DISCHARGE DIAGNOSIS:  Active Problems:   Pneumonia  Diagnosis at the time of death Acute on chronic hypoxic respiratory failure secondary to aspiration pneumonia Asystole  SECONDARY DIAGNOSIS:   Past Medical History  Diagnosis Date  . Diabetes mellitus without complication ([place])   . DJD (degenerative joint disease)   . BPH (benign prostatic hyperplasia)   . Hyperlipidemia   . History of hip replacement   . Pneumonia   . Melanoma ([place])   . 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 Swallow evaluation was positive for aspiration per ST. Patient was kept nothing by mouth. CAT scan of the chest did not reveal any pulmonary embolism. Patient was started on IV antibiotics Zosyn and given nebulizer treatment. Patient was on high flow oxygen and was pleasantly confused I had a lengthy discussion with the patient's wife and son at bedside regarding patient's current situation and comorbidities  patient being aspirating have given the options of palliative care versus PEG tube placement which might not be a great option in his case. Palliative care consult was placed. Patient was seen by [facility] RN David Beck in the past and they requested to talk to her and David Beck, oncologist before making further decisions. Eventually they have discussed with David Beck and David Beck. Agreed with the palliative care consult. Patient was eventually  transferred to 2 a and was continued on high flow oxygen. Subsequently patient was bradycardic with heart rate in 20s.Marland KitchenHeart rate continued to decrease until asystole. On call Md notified. Two RNs pronounced. Family notified. See flowsheet regarding all other information     CODE STATUS: DNR   DISCHARGE CONDITIONS:   Patient is deceased on 11/20/2014  CONSULTS OBTAINED:  Treatment Team:  David Sickle, MD   PROCEDURES  none DRUG ALLERGIES:  No Known Allergies  DISCHARGE MEDICATIONS:   Patient is deceased on November 20, 2014   DISCHARGE INSTRUCTIONS:   Patient is deceased on 11/20/2014  DIET:  Patient is deceased on November 20, 2014  DISCHARGE CONDITION:  Expired  ACTIVITY:    OXYGEN:  Patient is deceased on 2014-11-20 DISCHARGE LOCATION:  Patient is deceased on 2014/11/20  If you experience worsening of your admission symptoms, develop shortness of breath, life threatening emergency, suicidal or homicidal thoughts you must seek medical attention immediately by calling 911 or calling your MD immediately  if symptoms less severe.       RADIOLOGY:  No results found.  EKG:   Orders placed or performed during the hospital encounter of 10/18/2014  . ED EKG  . ED EKG  . EKG 12-Lead  . EKG 12-Lead      Management plans discussed with the patient, family and they are in agreement.  CODE STATUS:  Advance Directive Documentation        Most Recent Value   Type of Advance Directive  Living will   Pre-existing out of facility DNR order (yellow form or pink MOST form)     "  MOST" Form in Place?        TOTAL TIME TAKING CARE OF THIS PATIENT: 32minutes.    @[facility] @  on 11/08/2014 at 10:42 AM  Between 7am to 6pm - Pager - [phone]  After 6pm go to [email or web address] - password [email or web address] [facility]  Office  [phone]  CC: Primary care physician; David Body, MD

## 2014-11-14 NOTE — Evaluation (Signed)
Objective Swallowing Evaluation: Other (Comment)  Patient Details  Name: David Beck MRN: [id number] Date of Birth: 02-Nov-1920  Today's Date: 17-Nov-2014 Time: SLP Start Time (ACUTE ONLY): 1100-SLP Stop Time (ACUTE ONLY): 1200 SLP Time Calculation (min) (ACUTE ONLY): 60 min  Past Medical History:  Past Medical History  Diagnosis Date  . Diabetes mellitus without complication ([place])   . DJD (degenerative joint disease)   . BPH (benign prostatic hyperplasia)   . Hyperlipidemia   . History of hip replacement   . Pneumonia   . Melanoma ([place])   . 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 No past surgeries    . Abdominal aortic aneurysm repair      4 years ago  . Joint replacement    . Peripheral vascular catheterization N/A 06/08/2014    Procedure: Glori Luis Cath Insertion;  Surgeon: Algernon Huxley, MD;  Location: [facility];  Service: Cardiovascular;  Laterality: N/A;   HPI:  Other Pertinent Information: David Beck is a 81 y.o. male with a known history of lymphoma, COPD, chronic respiratory failure. This oxygen, diabetes presents to the emergency room complaining of worsening shortness of breath and desaturations on 4 L oxygen. Patient felt short of breath briefly yesterday which resolved on its own. Today with ongoing shortness of breath and saturations in the low 80s on 4 L oxygen present to the emergency room. Patient has been found to have bibasilar atelectasis on the chest x-ray. With elevated white count and possibility of pneumonia hospitalist team was contacted. Pt was seen at bedside for swallow evaluation and placed on a dysphagia diet. Pt was assessed w/ po trials of Nectar consistency liquids this AM but exhibited faiirly consistent, overt s/s of aspiration w/ all trials given. MD consulted and agred w/ recommendation for Objective swallow study to further assess swallow function.   No Data  Recorded  Assessment / Plan / Recommendation CHL IP CLINICAL IMPRESSIONS 11/17/2014  Therapy Diagnosis Moderate oral phase dysphagia;Severe pharyngeal phase dysphagia  Clinical Impression Pt appeared to present w/ moderate-severe oropharyngeal phase dysphagia c/b delayed pharyngeal swallow initiation resulting in aspiration of Nectar consistency liquids; laryngeal penetration of Honey consistency liquids. (No thin liquids were assesed during this study.) Pt also exhibited decreased pharyngeal pressure during the swallowing and poor epiglottic inversion resulting in increased pharyngeal residue post swallowing. This residue, along w/ a coating of residue along the underneath side of the epiglottis, then moved into the laryngeal vestibule as Penetration w/ Aspiration of residue noted as time passed b/t po trials. Pt was unable to effectively clear the pharynx of bolus residue during the swallow d/t the poor epiglottic inversion during the swallow as well as d/t the bony structures of the cervical spine, appearing similar to osteophytes, along the pharynx and the upper esophagus.       CHL IP TREATMENT RECOMMENDATION November 17, 2014  Treatment Recommendations (No Data)     CHL IP DIET RECOMMENDATION 11/17/14  SLP Diet Recommendations NPO  Liquid Administration via (None)  Medication Administration Via alternative means  Compensations (None)  Postural Changes and/or Swallow Maneuvers (None)     CHL IP OTHER RECOMMENDATIONS November 17, 2014  Recommended Consults (No Data)  Oral Care Recommendations Oral care QID;Staff/trained caregiver to provide oral care  Other Recommendations (No Data)     CHL IP FOLLOW UP RECOMMENDATIONS 11-17-14  Follow up Recommendations (No Data)     CHL IP FREQUENCY AND DURATION  Nov 16, 2014  Speech Therapy Frequency (ACUTE ONLY) (No Data)  Treatment Duration (No Data)     Pertinent Vitals/Pain denied    SLP Swallow Goals TBD      CHL IP REASON FOR REFERRAL Nov 16, 2014   Reason for Referral Objectively evaluate swallowing function     No flowsheet data found.    No flowsheet data found.    No flowsheet data found.  No flowsheet data found.        Orinda Kenner, MS, CCC-SLP  Watson,Katherine November 16, 2014, 4:03 PM

## 2014-11-14 NOTE — Progress Notes (Signed)
Speech Language Pathology Treatment: Dysphagia  Patient Details Name: David Beck MRN: [id number] DOB: [birth year] Today's Date: 11-10-14 Time: 1015-1100 SLP Time Calculation (min) (ACUTE ONLY): 45 min  Assessment / Plan / Recommendation Clinical Impression  Pt presented with consisted overt and immediate s/s of aspiration on all trials of nectar, despite aspiration precautions.  Inconsistent s/s of aspiration noted at morning meals as well.  Pt's wife reported coughing up phlemge after meals, the "color of the meal itself" (ST asked if he was coughing up food or phlemge; unclear response from wife). Given overt s/s of aspiration, ST ordered MBS to objectively assess swallow.  Pt is on HFNC with increased O2 support. Due to patient's presentation with PO trials, MD consulted for order of MBS objective swallow study.  Will make diet rec after MBS completed.  Family and Pt educated and informed.  NSG updated.   HPI Other Pertinent Information: Navin Dogan is a 81 y.o. male with a known history of lymphoma, COPD, chronic respiratory failure. This oxygen, diabetes presents to the emergency room complaining of worsening shortness of breath and desaturations on 4 L oxygen. Patient felt short of breath briefly yesterday which resolved on its own. Today with ongoing shortness of breath and saturations in the low 80s on 4 L oxygen present to the emergency room. Patient has been found to have bibasilar atelectasis on the chest x-ray. With elevated white count and possibility of pneumonia hospitalist team was contacted.   Pertinent Vitals Pain Assessment: No/denies pain  SLP Plan  MBS    Recommendations Diet recommendations:  (TBD based on MBS results) Liquids provided via: Cup;Straw Medication Administration:  (TBD based on MBS results) Supervision: Patient able to self feed;Staff to assist with self feeding Compensations: Slow rate;Small sips/bites;Minimize environmental distractions;Clear throat  intermittently Postural Changes and/or Swallow Maneuvers: Seated upright 90 degrees              Oral Care Recommendations: Staff/trained caregiver to provide oral care Follow up Recommendations:  (TBD) Plan: MBS    GO     Eain Mullendore 11-10-2014, 12:14 PM

## 2014-11-14 NOTE — Consult Note (Signed)
WOC wound consult note Reason for Consult:Weekly Unnas boots application.  Last applied 11/02/14.  Will be due again 11/09/14 or sooner if weeping and moisture necessitate it.  Dressing procedure/placement/frequency: Cleanse legs with soap and water and pat dry.  Apply zinc layer, followed by [place]. Change weekly. On Thursday.   [facility] team will follow.  Domenic Moras RN BSN [place] Pager [phone]

## 2014-11-14 NOTE — Progress Notes (Signed)
PT Cancellation Note  Patient Details Name: David Beck MRN: [id number] DOB: Nov 13, 1920   Cancelled Treatment:    Reason Eval/Treat Not Completed: Medical issues which prohibited therapy (Chart reviewed for attempted treatment session.  Patient recently transitioned to HFNC at FiO2 of 82%.  Not appropriate for activity at this time due to respiratory difficulty.  Will hold and re-attempt as medically appropriate.)  Jerris Keltz H. Owens Shark, PT, DPT, NCS 12/04/14, 10:00 AM [phone]

## 2014-11-14 NOTE — Progress Notes (Signed)
Patient has been awake majority of shift. Transitioned over to high flow early in the shift due to some desaturation on 5liter nasal cannula. Patient refused bipap therapy said that hose in nose was working fine and didn't wish to change. He did allow me however to place on high flow when needed. Patient tends to have various episodes of tachycardia (130s) and desaturation when talks a lot. Able to maintain O2 according and titrate down once patient returns to baseline and o2 saturation is normal.

## 2014-11-14 NOTE — Progress Notes (Signed)
Visit made. Patient seen sitting up in bed, alert with some confusion. He is currently on high flow nasal cannula, has refused Bipap per chart review. Respiratory therapist and transport present to take patient for his modified barium swallow, he is currently on a pureed diet with honey thick liquids after an episode of choking on 10/22. No family present  at time of visit. Patient changed to nonrebreather mask for transport. Of note patient is now a DNR. Will continue to follow through final disposition and update [facility] Team. No discharge plans at this time. Flo Shanks RN, BSN, [facility] Liaison [phone] c

## 2014-11-14 NOTE — Consult Note (Signed)
PULMONARY / CRITICAL CARE MEDICINE   Name: David Beck MRN: [id number] DOB: [birth year]    ADMISSION DATE:  10/23/2014 CONSULTATION DATE:  11/04/14  REFERRING MD :  Dr. Darvin Neighbours   CHIEF COMPLAINT:   Shortness of breath/resp failure   HISTORY OF PRESENT ILLNESS   Failed speech eval, resp failure, fio2 at 100%, on high flow [place]    SUBJECTIVE: Not doing well, resp distress       PAST MEDICAL HISTORY    :  Past Medical History  Diagnosis Date  . Diabetes mellitus without complication ([place])   . DJD (degenerative joint disease)   . BPH (benign prostatic hyperplasia)   . Hyperlipidemia   . History of hip replacement   . Pneumonia   . Melanoma ([place])   . Small cell B-cell lymphoma [facility])    Past Surgical History  Procedure Laterality Date  . No past surgeries    . Abdominal aortic aneurysm repair      4 years ago  . Joint replacement    . Peripheral vascular catheterization N/A 06/08/2014    Procedure: Glori Luis Cath Insertion;  Surgeon: Algernon Huxley, MD;  Location: [facility];  Service: Cardiovascular;  Laterality: N/A;   Prior to Admission medications   Medication Sig Start Date End Date Taking? Authorizing Provider  Alum & Mag Hydroxide-Simeth (MAGIC MOUTHWASH) SOLN Take 5 mLs by mouth 4 (four) times daily as needed for mouth pain. 07/26/14  Yes Lloyd Huger, MD  digoxin (LANOXIN) 0.125 MG tablet Take 1 tablet (0.125 mg total) by mouth daily. 08/13/14  Yes Bettey Costa, MD  docusate sodium (COLACE) 100 MG capsule Take 1 capsule (100 mg total) by mouth 2 (two) times daily. Patient taking differently: Take 200 mg by mouth 2 (two) times daily.  08/13/14  Yes Sital Mody, MD  glipiZIDE (GLUCOTROL) 10 MG tablet Take 5 mg by mouth 2 (two) times daily.   Yes Historical Provider, MD  lidocaine-prilocaine (EMLA) cream Apply 1 application topically as needed. Apply to port area 1-2 hours prior to chemotherapy treatment appointment, cover with plastic wrap. Patient  taking differently: Apply 1 application topically as needed (prior to accessing port). Apply to port area 1-2 hours prior to chemotherapy treatment appointment, cover with plastic wrap. 06/15/14  Yes Lloyd Huger, MD  megestrol (MEGACE) 40 MG/ML suspension Take 10 mLs (400 mg total) by mouth daily. 06/15/14  Yes Lloyd Huger, MD  Oxycodone HCl 10 MG TABS Take 1 tablet (10 mg total) by mouth every 6 (six) hours as needed. Patient taking differently: Take 10 mg by mouth every 6 (six) hours as needed (for pain).  07/14/14  Yes Lloyd Huger, MD  predniSONE (DELTASONE) 50 MG tablet Take 1 tablet daily for 5 days with each chemotherapy treatment Patient taking differently: Take 50 mg by mouth daily. Pt takes one tablet daily for 5 days with each chemotherapy treatment. 06/01/14  Yes Lloyd Huger, MD  prochlorperazine (COMPAZINE) 10 MG tablet Take 1 tablet (10 mg total) by mouth every 6 (six) hours as needed for nausea or vomiting. 06/22/14  Yes Lloyd Huger, MD  diltiazem (CARDIZEM CD) 180 MG 24 hr capsule Take 1 capsule (180 mg total) by mouth daily. Patient not taking: Reported on 11/11/2014 08/13/14   Bettey Costa, MD  feeding supplement, ENSURE ENLIVE, (ENSURE ENLIVE) LIQD Take 237 mLs by mouth 3 (three) times daily with meals. Patient not taking: Reported on 11/10/2014 08/13/14   Bettey Costa, MD  No Known Allergies   FAMILY HISTORY   Family History  Problem Relation Age of Onset  . Stomach cancer Mother   . Melanoma Brother   . Diabetes Brother   . CVA Brother       SOCIAL HISTORY    reports that he quit smoking about 36 years ago. His smoking use included Cigarettes. He does not have any smokeless tobacco history on file. He reports that he does not drink alcohol or use illicit drugs.  Review of Systems  Constitutional: Negative for fever, chills, weight loss and malaise/fatigue.  Eyes: Negative for blurred vision and double vision.  Respiratory: Positive for cough  and shortness of breath.        Sob improving,  Mild fine crackles at the bases  Cardiovascular: Positive for palpitations.  Gastrointestinal: Negative for heartburn and nausea.  Genitourinary: Negative for dysuria and urgency.  Musculoskeletal: Negative for myalgias.  Skin: Negative for itching.  Neurological: Negative for dizziness and headaches.  Endo/Heme/Allergies: Does not bruise/bleed easily.  Psychiatric/Behavioral: Negative for depression.      VITAL SIGNS    Temp:  [97 F (36.1 C)-97.9 F (36.6 C)] 97.9 F (36.6 C) (10/24 0748) Pulse Rate:  [35-138] 85 (10/24 1100) Resp:  [10-34] 21 (10/24 1100) BP: (98-138)/(56-94) 134/73 mmHg (10/24 1100) SpO2:  [86 %-100 %] 91 % (10/24 1100) FiO2 (%):  [50 %-90 %] 82 % (10/24 0903) Weight:  [151 lb 0.2 oz (68.5 kg)] 151 lb 0.2 oz (68.5 kg) (10/24 0418) HEMODYNAMICS:   VENTILATOR SETTINGS: Vent Mode:  [-]  FiO2 (%):  [50 %-90 %] 82 % INTAKE / OUTPUT:  Intake/Output Summary (Last 24 hours) at Nov 13, 2014 1207 Last data filed at 13-Nov-2014 0118  Gross per 24 hour  Intake    373 ml  Output    100 ml  Net    273 ml       PHYSICAL EXAM   Physical Exam  Constitutional: He is oriented to person, place, and time.  Cachetic appearance  HENT:  Head: Normocephalic and atraumatic.  Right Ear: External ear normal.  Left Ear: External ear normal.  Eyes: Pupils are equal, round, and reactive to light.  Neck: Normal range of motion. Neck supple.  Cardiovascular: Normal rate, regular rhythm and normal heart sounds.   Pulmonary/Chest: Effort normal. No respiratory distress. He has no wheezes.  Dec BS at the bases.  Mild fine crackles at the bases  Abdominal: Soft. Bowel sounds are normal.  Musculoskeletal: Normal range of motion.  Neurological: He is alert and oriented to person, place, and time.  Skin: Skin is warm and dry.  Psychiatric: He has a normal mood and affect.  Nursing note and vitals reviewed.      LABS    LABS:  CBC  Recent Labs Lab 10/15/2014 1416 11/04/14 0501  WBC 14.1* 13.2*  HGB 9.5* 9.9*  HCT 30.2* 31.5*  PLT 174 194   Coag's No results for input(s): APTT, INR in the last 168 hours. BMET  Recent Labs Lab 10/28/2014 1416 11/04/14 0501  NA 139 140  K 3.8 4.2  CL 102 104  CO2 30 27  BUN 18 18  CREATININE 0.42* 0.63  GLUCOSE 132* 187*   Electrolytes  Recent Labs Lab 10/15/2014 1416 11/04/14 0501  CALCIUM 8.2* 8.3*   Sepsis Markers  Recent Labs Lab 10/24/2014 1519 10/30/2014 1837  LATICACIDVEN 0.9 1.8   ABG  Recent Labs Lab 10/29/2014 1350  PHART 7.45  PCO2ART 42  PO2ART  188*   Liver Enzymes No results for input(s): AST, ALT, ALKPHOS, BILITOT, ALBUMIN in the last 168 hours. Cardiac Enzymes  Recent Labs Lab 11/08/2014 1416  TROPONINI 0.03   Glucose  Recent Labs Lab 11/05/14 0746 11/05/14 1210 11/05/14 1616 11/05/14 2200 11/19/2014 0716 11-19-14 1145  GLUCAP 226* 177* 116* 203* 176* 109*     Recent Results (from the past 240 hour(s))  Blood culture (routine x 2)     Status: None (Preliminary result)   Collection Time: 10/29/2014  2:16 PM  Result Value Ref Range Status   Specimen Description BLOOD RIGHT PORTA CATH  Final   Special Requests BOTTLES DRAWN AEROBIC AND ANAEROBIC  5CC  Final   Culture NO GROWTH 2 DAYS  Final   Report Status PENDING  Incomplete  Blood culture (routine x 2)     Status: None (Preliminary result)   Collection Time: 10/24/2014  3:19 PM  Result Value Ref Range Status   Specimen Description BLOOD RIGHT AC  Final   Special Requests   Final    BOTTLES DRAWN AEROBIC AND ANAEROBIC  AER [place] ANA [place]   Culture NO GROWTH 2 DAYS  Final   Report Status PENDING  Incomplete  MRSA PCR Screening     Status: None   Collection Time: 10/14/2014  7:45 PM  Result Value Ref Range Status   MRSA by PCR NEGATIVE NEGATIVE Final    Comment:        The GeneXpert MRSA Assay (FDA approved for NASAL specimens only), is one component of  a comprehensive MRSA colonization surveillance program. It is not intended to diagnose MRSA infection nor to guide or monitor treatment for MRSA infections.      Current facility-administered medications:  .  0.9 %  sodium chloride infusion, 250 mL, Intravenous, PRN, Hillary Bow, MD .  acetaminophen (TYLENOL) tablet 650 mg, 650 mg, Oral, Q6H PRN **OR** acetaminophen (TYLENOL) suppository 650 mg, 650 mg, Rectal, Q6H PRN, Hillary Bow, MD .  antiseptic oral rinse (CPC / CETYLPYRIDINIUM CHLORIDE 0.05%) solution 7 mL, 7 mL, Mouth Rinse, q12n4p, Srikar Sudini, MD, 7 mL at 11-19-14 1200 .  azithromycin (ZITHROMAX) 500 mg in dextrose 5 % 250 mL IVPB, 500 mg, Intravenous, Q24H, Charlett Nose, RPH .  cefTRIAXone (ROCEPHIN) 1 g in dextrose 5 % 50 mL IVPB, 1 g, Intravenous, Q24H, Charlett Nose, RPH .  chlorhexidine (PERIDEX) 0.12 % solution 15 mL, 15 mL, Mouth Rinse, BID, Srikar Sudini, MD, 15 mL at November 19, 2014 1000 .  digoxin (LANOXIN) tablet 0.125 mg, 0.125 mg, Oral, Daily, Srikar Sudini, MD, 0.125 mg at 11/19/2014 0905 .  diltiazem (CARDIZEM CD) 24 hr capsule 180 mg, 180 mg, Oral, Daily, Hillary Bow, MD, 180 mg at 11-19-2014 0904 .  docusate sodium (COLACE) capsule 200 mg, 200 mg, Oral, BID, Hillary Bow, MD, 200 mg at November 19, 2014 0904 .  enoxaparin (LOVENOX) injection 40 mg, 40 mg, Subcutaneous, Q24H, Charlett Nose, RPH .  glipiZIDE (GLUCOTROL) tablet 5 mg, 5 mg, Oral, BID, Hillary Bow, MD, 5 mg at 2014-11-19 0745 .  insulin aspart (novoLOG) injection 0-5 Units, 0-5 Units, Subcutaneous, QHS, Hillary Bow, MD, 2 Units at 11/05/14 2236 .  insulin aspart (novoLOG) injection 0-9 Units, 0-9 Units, Subcutaneous, TID WC, Hillary Bow, MD, 2 Units at 2014-11-19 0745 .  ipratropium-albuterol (DUONEB) 0.5-2.5 (3) MG/3ML nebulizer solution 3 mL, 3 mL, Nebulization, Q4H, Srikar Sudini, MD, 3 mL at 11-19-14 0902 .  magic mouthwash, 5 mL, Oral, QID PRN, Hillary Bow, MD .  methylPREDNISolone sodium  succinate (SOLU-MEDROL) 40 mg/mL injection 30 mg, 30 mg, Intravenous, Q24H, Cammie Sickle, MD, 30 mg at 11/05/14 1542 .  metoprolol (LOPRESSOR) injection 5 mg, 5 mg, Intravenous, Q4H PRN, Hillary Bow, MD, 5 mg at 11/04/14 2329 .  ondansetron (ZOFRAN) tablet 4 mg, 4 mg, Oral, Q6H PRN **OR** ondansetron (ZOFRAN) injection 4 mg, 4 mg, Intravenous, Q6H PRN, Srikar Sudini, MD .  oxyCODONE (Oxy IR/ROXICODONE) immediate release tablet 10 mg, 10 mg, Oral, Q6H PRN, Srikar Sudini, MD .  sodium chloride 0.9 % injection 3 mL, 3 mL, Intravenous, Q12H, Hillary Bow, MD, 3 mL at 2014/11/29 0905 .  sodium chloride 0.9 % injection 3 mL, 3 mL, Intravenous, Q12H, Hillary Bow, MD, 3 mL at Nov 29, 2014 0905 .  sodium chloride 0.9 % injection 3 mL, 3 mL, Intravenous, PRN, Hillary Bow, MD  IMAGING    No results found.    Lines:  Cultures: Bld Cx x 2 10/21>>  Antibiotics: Azithro 10/21>> Rocephin 10/21>>  ASSESSMENT/PLAN  81 yo male with PMHx of lymphoma, COPD (O2 Dependent), DM, presenting with dyspnea, found to have severe aspiration pneumonia on Chest CT.   Acute on chronic resp failure -worsening-prognosis is poor, patient with severe aspiration-will need PEG tube O/W hospice care Bibasilar pneumonia vs atelectasis Pulmonary lymphadenopathy  Plan: -Cont with IV antibiotics (rocephin and azithro), given that he is immunocompromised may need to expand coverage to HCAP if clinical condition worsens-will add zosyn. -Recently started on Rituxan - possible pneumonia, pneumonitis, or respiratory tract infection -Cont with scheduled nebs -follow up blood and sputum cultures -maintain O2 sats >88%  * Afib with RVR - chronic, and difficult to control (follow with [place], Dr. Nehemiah Massed) - has a known history - cont with home meds - Afib with RVR most likely due to underlying condition - cont with cardiac monitoring.   * Lymphoma - Follows at [facility] cancer center. - poor prognosis given age and  current stage of lymphoma - per Dr. Grayland Ormond last note  * DM - SSI - Diabetic diet.  *Social - updated daughter  at bedside - DNR    I have personally obtained a history, examined the patient, evaluated Pertinent laboratory and RadioGraph[employer]/imaging results, and  formulated the assessment and plan The Patient requires high complexity decision making for assessment and support, frequent evaluation and titration of therapies, application of advanced monitoring technologies and extensive interpretation of multiple databases. Critical Care Time devoted to patient care services described in this note is 45 minutes.   Overall, patient is critically ill, prognosis is guarded.    Corrin Parker, M.D.  [facility]  Medical Director [place] Director [facility] Cardio-Pulmonary Department

## 2014-11-14 NOTE — Progress Notes (Signed)
ANTIBIOTIC CONSULT NOTE - INITIAL  Pharmacy Consult for Zosyn Indication: pneumonia  No Known Allergies  Patient Measurements: Height: 5\' 10"  (177.8 cm) Weight: 151 lb 0.2 oz (68.5 kg) IBW/kg (Calculated) : 73   Vital Signs: Temp: 97.9 F (36.6 C) (10/24 0748) BP: 122/74 mmHg (10/24 1400) Pulse Rate: 69 (10/24 1400) Intake/Output from previous day: 10/23 0701 - 10/24 0700 In: 451 [P.O.:195; I.V.:6; IV Piggyback:250] Out: 125 [Urine:125] Intake/Output from this shift: Total I/O In: 270 [P.O.:220; IV Piggyback:50] Out: -   Labs:  Recent Labs  11/04/14 0501  WBC 13.2*  HGB 9.9*  PLT 194  CREATININE 0.63   Estimated Creatinine Clearance: 58.3 mL/min (by C-G formula based on Cr of 0.63). No results for input(s): VANCOTROUGH, VANCOPEAK, VANCORANDOM, GENTTROUGH, GENTPEAK, GENTRANDOM, TOBRATROUGH, TOBRAPEAK, TOBRARND, AMIKACINPEAK, AMIKACINTROU, AMIKACIN in the last 72 hours.   Microbiology: Recent Results (from the past 720 hour(s))  Blood culture (routine x 2)     Status: None (Preliminary result)   Collection Time: 10/18/2014  2:16 PM  Result Value Ref Range Status   Specimen Description BLOOD RIGHT PORTA CATH  Final   Special Requests BOTTLES DRAWN AEROBIC AND ANAEROBIC  5CC  Final   Culture NO GROWTH 2 DAYS  Final   Report Status PENDING  Incomplete  Blood culture (routine x 2)     Status: None (Preliminary result)   Collection Time: 11/02/2014  3:19 PM  Result Value Ref Range Status   Specimen Description BLOOD RIGHT AC  Final   Special Requests   Final    BOTTLES DRAWN AEROBIC AND ANAEROBIC  AER [place] ANA [place]   Culture NO GROWTH 2 DAYS  Final   Report Status PENDING  Incomplete  MRSA PCR Screening     Status: None   Collection Time: 10/22/2014  7:45 PM  Result Value Ref Range Status   MRSA by PCR NEGATIVE NEGATIVE Final    Comment:        The GeneXpert MRSA Assay (FDA approved for NASAL specimens only), is one component of a comprehensive MRSA  colonization surveillance program. It is not intended to diagnose MRSA infection nor to guide or monitor treatment for MRSA infections.     Medical History: Past Medical History  Diagnosis Date  . Diabetes mellitus without complication ([place])   . DJD (degenerative joint disease)   . BPH (benign prostatic hyperplasia)   . Hyperlipidemia   . History of hip replacement   . Pneumonia   . Melanoma ([place])   . Small cell B-cell lymphoma ([facility])     Medications:  Scheduled:  . antiseptic oral rinse  7 mL Mouth Rinse q12n4p  . chlorhexidine  15 mL Mouth Rinse BID  . digoxin  0.125 mg Oral Daily  . diltiazem  180 mg Oral Daily  . docusate sodium  200 mg Oral BID  . enoxaparin (LOVENOX) injection  40 mg Subcutaneous Q24H  . glipiZIDE  5 mg Oral BID  . insulin aspart  0-5 Units Subcutaneous QHS  . insulin aspart  0-9 Units Subcutaneous TID WC  . ipratropium-albuterol  3 mL Nebulization Q4H  . piperacillin-tazobactam  3.375 g Intravenous 3 times per day  . sodium chloride  3 mL Intravenous Q12H  . sodium chloride  3 mL Intravenous Q12H   Infusions:   PRN: sodium chloride, acetaminophen **OR** acetaminophen, magic mouthwash, metoprolol, ondansetron **OR** ondansetron (ZOFRAN) IV, oxyCODONE, sodium chloride  Assessment: 81 y/o M with acute on chronic respiratory failure with PNA on ceftriaxone and  azithromycin to change abx to Zosyn.   Plan:  Will order Zosyn 3.375 g EI q 8 hours and continue to follow renal function and culture results.   Ulice Dash D November 08, 2014,2:21 PM

## 2014-11-14 NOTE — Progress Notes (Signed)
[place]  [phone] [phone]) [phone]  ID: [name] Setting OB: Dec 13, 1920  MR#: [id number]  SFK#:[id number]  Patient Care Team: Dion Body, MD as PCP - General (Family Medicine) Algernon Huxley, MD as Consulting Physician (Vascular Surgery)  CHIEF COMPLAINT:  Chief Complaint  Patient presents with  . Shortness of Breath    INTERVAL HISTORY: Patient recently admitted the hospital with worsening shortness of breath and continues to require high flow oxygen. He says his shortness of breath has improved since admission. He is intermittently confused, but offers no further complaints.  REVIEW OF SYSTEMS:   Review of Systems  Constitutional: Positive for weight loss and malaise/fatigue. Negative for fever.  Respiratory: Positive for cough. Negative for hemoptysis.   Cardiovascular: Negative.   Gastrointestinal: Negative.   Musculoskeletal: Negative.   Neurological: Positive for weakness.    As per HPI. Otherwise, a complete review of systems is negatve.  PAST MEDICAL HISTORY: Past Medical History  Diagnosis Date  . Diabetes mellitus without complication ([place])   . DJD (degenerative joint disease)   . BPH (benign prostatic hyperplasia)   . Hyperlipidemia   . History of hip replacement   . Pneumonia   . Melanoma ([place])   . 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 No past surgeries    . Abdominal aortic aneurysm repair      4 years ago  . Joint replacement    . Peripheral vascular catheterization N/A 06/08/2014    Procedure: Glori Luis Cath Insertion;  Surgeon: Algernon Huxley, MD;  Location: [facility];  Service: Cardiovascular;  Laterality: N/A;    FAMILY HISTORY Family History  Problem Relation Age of Onset  . Stomach cancer Mother   . Melanoma Brother   . Diabetes Brother   . CVA Brother        ADVANCED DIRECTIVES:    HEALTH MAINTENANCE: Social  History  Substance Use Topics  . Smoking status: Former Smoker    Types: Cigarettes    Quit date: 01/14/1978  . Smokeless tobacco: None  . Alcohol Use: No     Colonoscopy:  PAP:  Bone density:  Lipid panel:  No Known Allergies  Current Facility-Administered Medications  Medication Dose Route Frequency Provider Last Rate Last Dose  . 0.9 %  sodium chloride infusion  250 mL Intravenous PRN Hillary Bow, MD      . acetaminophen (TYLENOL) tablet 650 mg  650 mg Oral Q6H PRN Hillary Bow, MD       Or  . acetaminophen (TYLENOL) suppository 650 mg  650 mg Rectal Q6H PRN Srikar Sudini, MD      . antiseptic oral rinse (CPC / CETYLPYRIDINIUM CHLORIDE 0.05%) solution 7 mL  7 mL Mouth Rinse q12n4p Srikar Sudini, MD   7 mL at 2014-12-01 1600  . chlorhexidine (PERIDEX) 0.12 % solution 15 mL  15 mL Mouth Rinse BID Hillary Bow, MD   15 mL at 12-01-2014 1533  . digoxin (LANOXIN) tablet 0.125 mg  0.125 mg Oral Daily Srikar Sudini, MD   0.125 mg at December 01, 2014 0905  . diltiazem (CARDIZEM CD) 24 hr capsule 180 mg  180 mg Oral Daily Hillary Bow, MD   180 mg at 12-01-14 0904  . docusate sodium (COLACE) capsule 200 mg  200 mg Oral BID Hillary Bow, MD   200 mg at 12-01-14 0904  . enoxaparin (LOVENOX) injection 40 mg  40 mg  Subcutaneous Q24H Charlett Nose, RPH      . glipiZIDE (GLUCOTROL) tablet 5 mg  5 mg Oral BID Hillary Bow, MD   5 mg at Nov 24, 2014 0745  . insulin aspart (novoLOG) injection 0-5 Units  0-5 Units Subcutaneous QHS Hillary Bow, MD   2 Units at 11/05/14 2236  . insulin aspart (novoLOG) injection 0-9 Units  0-9 Units Subcutaneous TID WC Hillary Bow, MD   2 Units at 24-Nov-2014 0745  . ipratropium-albuterol (DUONEB) 0.5-2.5 (3) MG/3ML nebulizer solution 3 mL  3 mL Nebulization Q4H Srikar Sudini, MD   3 mL at 2014-11-24 1541  . magic mouthwash  5 mL Oral QID PRN Hillary Bow, MD      . metoprolol (LOPRESSOR) injection 5 mg  5 mg Intravenous Q4H PRN Hillary Bow, MD   5 mg at 11/04/14 2329    . ondansetron (ZOFRAN) tablet 4 mg  4 mg Oral Q6H PRN Hillary Bow, MD       Or  . ondansetron (ZOFRAN) injection 4 mg  4 mg Intravenous Q6H PRN Srikar Sudini, MD      . oxyCODONE (Oxy IR/ROXICODONE) immediate release tablet 10 mg  10 mg Oral Q6H PRN Srikar Sudini, MD      . piperacillin-tazobactam (ZOSYN) IVPB 3.375 g  3.375 g Intravenous 3 times per day Nicholes Mango, MD   3.375 g at 2014/11/24 1303  . sodium chloride 0.9 % injection 3 mL  3 mL Intravenous Q12H Hillary Bow, MD   3 mL at 2014/11/24 0905  . sodium chloride 0.9 % injection 3 mL  3 mL Intravenous Q12H Hillary Bow, MD   3 mL at 11-24-14 0905  . sodium chloride 0.9 % injection 3 mL  3 mL Intravenous PRN Srikar Sudini, MD        OBJECTIVE: Filed Vitals:   2014/11/24 1700  BP: 122/80  Pulse: 69  Temp:   Resp: 20     Body mass index is 21.67 kg/(m^2).    ECOG FS:4 - Bedbound  General: Ill-appearing, no acute distress. Eyes: Pink conjunctiva, anicteric sclera. Lungs: Diminished breath sounds, scattered wheezing. Heart: Regular rate and rhythm. No rubs, murmurs, or gallops. Abdomen: Soft, nontender, nondistended. No organomegaly noted, normoactive bowel sounds. Musculoskeletal: No edema, cyanosis, or clubbing. Neuro: Alert, answering all questions appropriately. Cranial nerves grossly intact. Skin: No rashes or petechiae noted. Psych: Normal affect.  LAB RESULTS:  Lab Results  Component Value Date   NA 140 11/04/2014   K 4.2 11/04/2014   CL 104 11/04/2014   CO2 27 11/04/2014   GLUCOSE 187* 11/04/2014   BUN 18 11/04/2014   CREATININE 0.63 11/04/2014   CALCIUM 8.3* 11/04/2014   PROT 5.3* 10/19/2014   ALBUMIN 2.7* 10/19/2014   AST 19 10/19/2014   ALT 12* 10/19/2014   ALKPHOS 76 10/19/2014   BILITOT 0.4 10/19/2014   GFRNONAA >60 11/04/2014   GFRAA >60 11/04/2014    Lab Results  Component Value Date   WBC 13.2* 11/04/2014   NEUTROABS 12.9* 10/18/2014   HGB 9.9* 11/04/2014   HCT 31.5* 11/04/2014   MCV 87.9  11/04/2014   PLT 194 11/04/2014     STUDIES: Ct Angio Chest Pe W/cm &/or Wo Cm  10/30/2014  CLINICAL DATA:  Increased shortness of breath and weakness 1 week. Hypoxia. Diffuse large B-cell lymphoma. EXAM: CT ANGIOGRAPHY CHEST WITH CONTRAST TECHNIQUE: Multidetector CT imaging of the chest was performed using the standard protocol during bolus administration of intravenous contrast. Multiplanar CT image reconstructions and  MIPs were obtained to evaluate the vascular anatomy. CONTRAST:  23mL OMNIPAQUE IOHEXOL 350 MG/ML SOLN COMPARISON:  08/09/2014 FINDINGS: Right IJ centri is catheter has tip over the cavoatrial junction. Small to moderate bilateral pleural effusions are present right greater than left which are worse. Minimal loculation of fluid over the lateral left mid to lower lung. There is consolidation over the lower lobes likely atelectasis although cannot exclude infection. There is progressive diffuse bilateral interstitial prominence with patchy areas of mixed interstitial airspace opacification. Small web over the right mainstem bronchus. Heart is normal in size. There is mild calcified plaque over the left main and 3 vessel coronary arteries. Pulmonary arterial system is well opacified as there is no evidence of pulmonary emboli. 1.1 cm precarinal lymph node. Two adjacent 1.1 cm prevascular lymph nodes. 1.6 cm precarinal lymph node 1.3 cm subcarinal lymph node. Adenopathy overall worse compared to the prior exam. No significant hilar or axillary adenopathy. Remaining mediastinal structures are unchanged. Images through the upper abdomen are unchanged. There are moderate degenerative changes of the spine. Review of the MIP images confirms the above findings. IMPRESSION: No evidence pulmonary embolism. Interval progression of diffuse interstitial disease with minimal patchy mixed interstitial airspace density. Findings may be due to infection versus progression of patient's lymphoma or treatment  related changes. Mild worsening of mediastinal adenopathy which may be reactive versus lymphomatous disease. Worsening small to moderate bilateral pleural effusions right greater than left with minimal loculation over the lateral left mid to lower lung. Associated consolidation in the lower lobes likely atelectasis although cannot exclude infection. Atherosclerotic coronary artery disease. Electronically Signed   By: Marin Olp M.D.   On: 11/05/2014 17:25   Dg Chest Portable 1 View  10/18/2014  CLINICAL DATA:  Increased weakness and difficulty breathing for 1 week EXAM: PORTABLE CHEST - 1 VIEW COMPARISON:  08/12/2014 FINDINGS: Cardiac shadow remains enlarged. A right-sided chest wall port is again seen. Stable left-sided pleural effusion is noted. Diffuse interstitial changes are again noted. Persistent bibasilar airspace disease is seen. IMPRESSION: No significant interval change from the prior exam. Electronically Signed   By: Inez Catalina M.D.   On: 10/23/2014 14:17    ASSESSMENT: Diffuse large B-cell lymphoma, possible acute interstitial pneumonitis, aspiration.  PLAN:    1. Diffuse large B-cell lymphoma: Patient is status post 6 cycles of R CVP. His last chemotherapy was approximately one month ago. No further treatments are planned at this time. Patient initially had a restaging CT scan in 1 week, but will cancel and reschedule until his acute symptoms resolve. I suspect the possible progression on his most recent CT scan is more likely related to his acute pneumonitis than progression of disease. 2. Respiratory failure: Likely secondary to acute interstitial pneumonitis. Appreciate pulmonology input. Patient improving with antibiotics and steroids. 3. Aspiration: Appreciate speech pathology input. Patient is currently NPO. After lengthy discussion with the patient, his wife, and 2 sons. Have agreed that a PEG tube is not a good option at this time. Agree with palliative care consult. Patient's  son had concern with nutrition which can be further addressed if his acute symptoms resolve.  Will follow.   DLBCL (diffuse large B cell lymphoma) ([place])   Staging form: Lymphoid Neoplasms, AJCC 6th Edition     Clinical stage from 06/16/2014: Stage IV - Unsigned   Lloyd Huger, MD   11-26-2014 6:03 PM

## 2014-11-14 NOTE — Progress Notes (Addendum)
Notified by attending physician Dr. Margaretmary Eddy that family wanted to speak with [facility] Liaison. Writer had a long conversation with patient's wife and son David Beck and daughter David Beck via speaker phone, regarding patient's current decline, oxygen requirements, aspirations and possibility of peg tube placement. Mrs. Epple feels that her husband would not want a feeding tube. Both David Beck and David Beck expressed the concern of "giving up to easily". They all feel that they would be open to hospice services, but also feel very strongly that they need to speak with oncologist Dr. Grayland Ormond to hear his opinion regarding further treatment vs hospice. Dr. Margaretmary Eddy made aware. Will await further input from medical team. Flo Shanks RN, BSN, [facility] Liaison [phone] c

## 2014-11-14 DEATH — deceased

## 2014-11-16 ENCOUNTER — Ambulatory Visit: Payer: Medicare Other

## 2014-11-20 ENCOUNTER — Other Ambulatory Visit: Payer: Medicare Other

## 2014-11-20 ENCOUNTER — Ambulatory Visit: Payer: Medicare Other | Admitting: Oncology

## 2015-01-11 ENCOUNTER — Other Ambulatory Visit: Payer: Self-pay | Admitting: Nurse Practitioner

## 2016-07-31 IMAGING — CT CT ANGIO CHEST
1 of 2 series · 18 of 30 positions shown · IV contrast (APPLIED)
Comparison: 06/13/2014 and 04/28/2014

CLINICAL DATA: Dizziness

EXAM:
CT ANGIOGRAPHY CHEST WITH CONTRAST
TECHNIQUE: Multidetector CT imaging of the chest was performed using the
standard protocol during bolus administration of intravenous
contrast. Multiplanar CT image reconstructions and MIPs were
obtained to evaluate the vascular anatomy.
CONTRAST:  100mL OMNIPAQUE IOHEXOL 350 MG/ML SOLN

[Series 5: pe 1.0 thins · axial · 0.74mm/px · z∈[-310,-46]mm · 18 of 298 slices shown]
[im 17/298  lung]
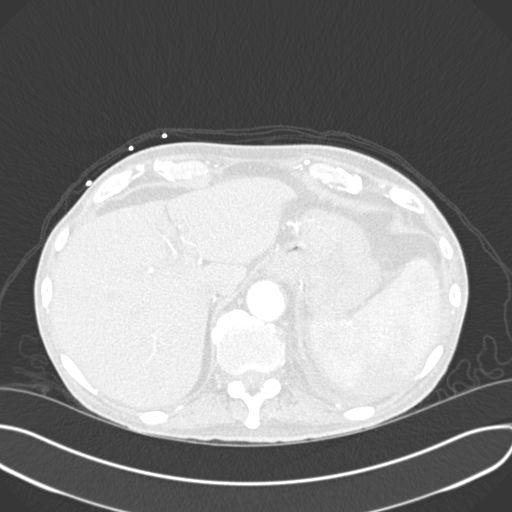
[im 34/298  mediastinal]
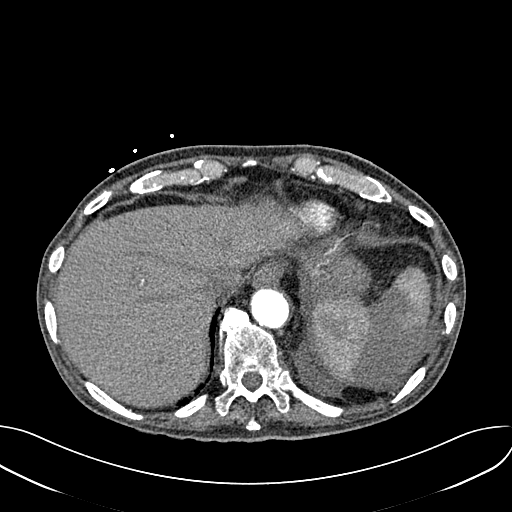
[im 50/298  lung]
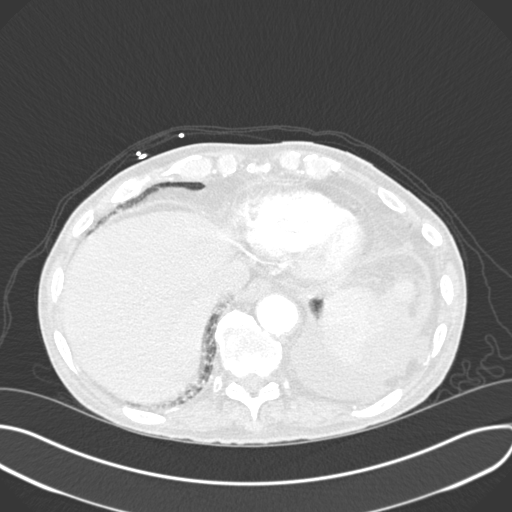
[im 67/298  mediastinal]
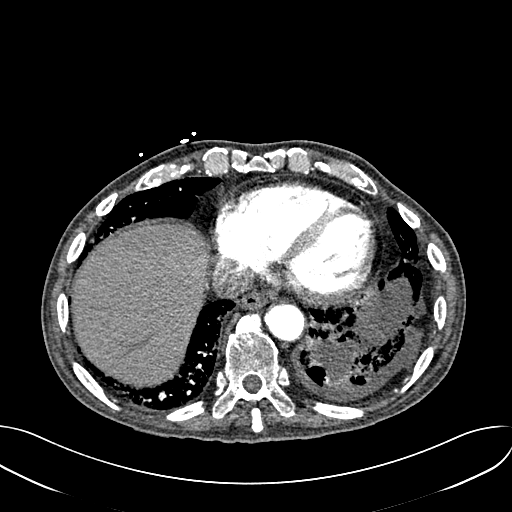
[im 83/298  lung]
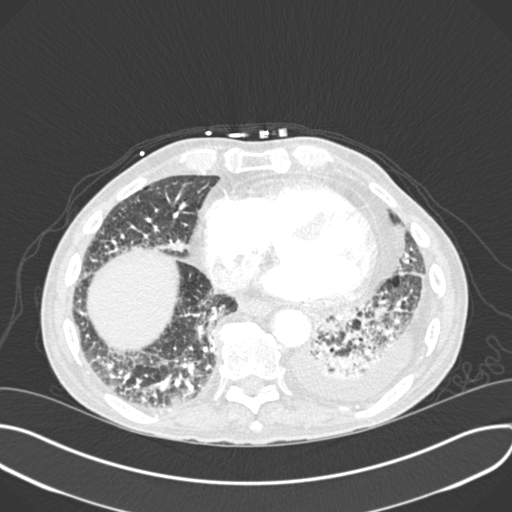
[im 100/298  mediastinal]
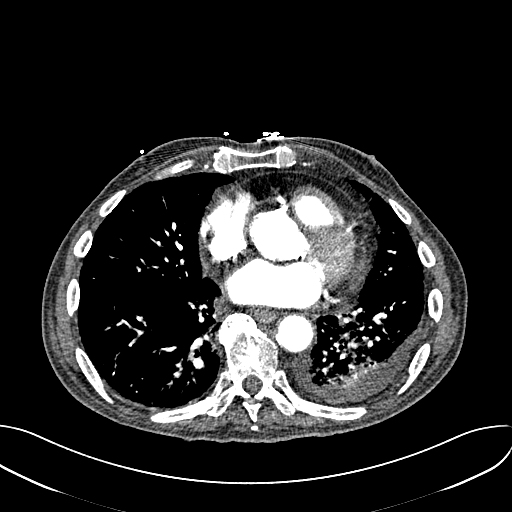
[im 116/298  lung]
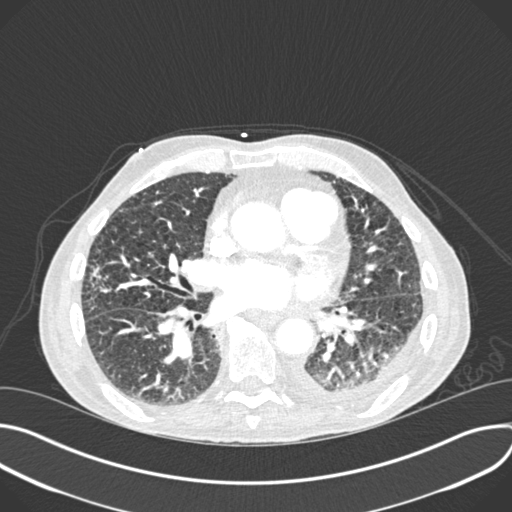
[im 133/298  mediastinal]
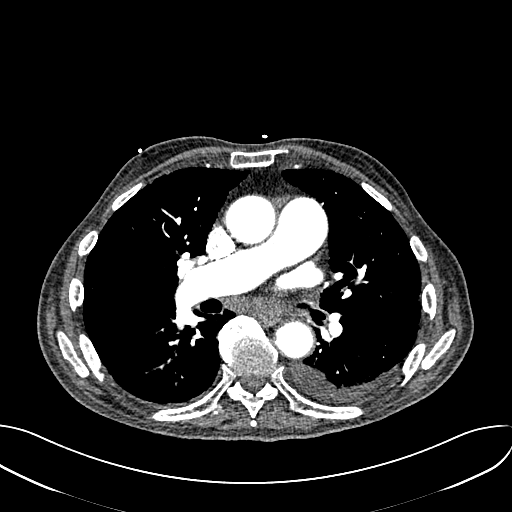
[im 140/298  lung]
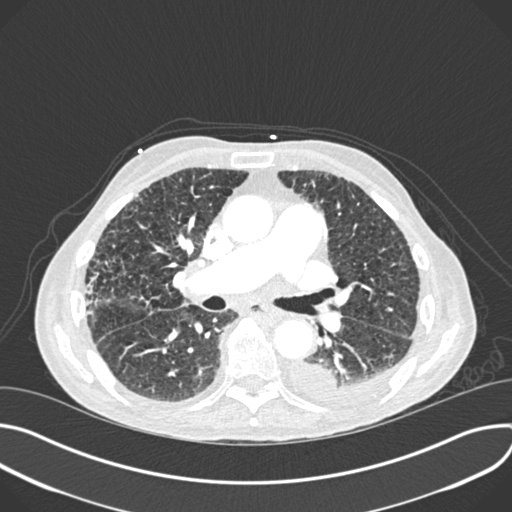
[im 149/298  mediastinal]
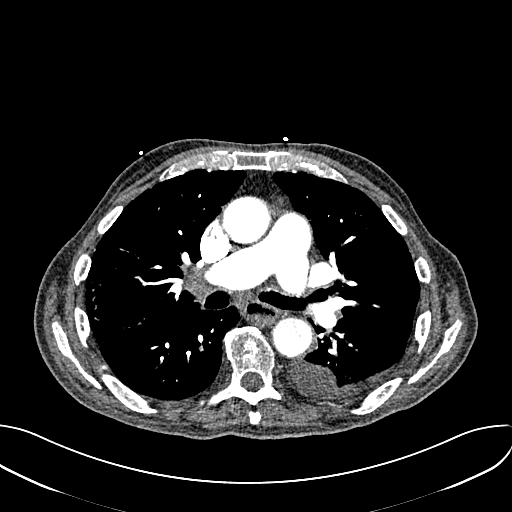
[im 166/298  lung]
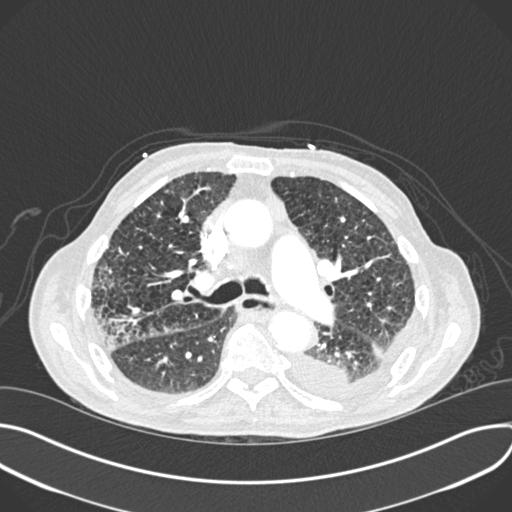
[im 182/298  mediastinal]
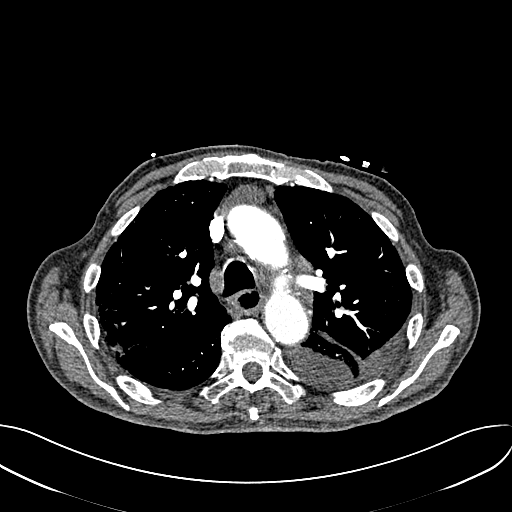
[im 199/298  lung]
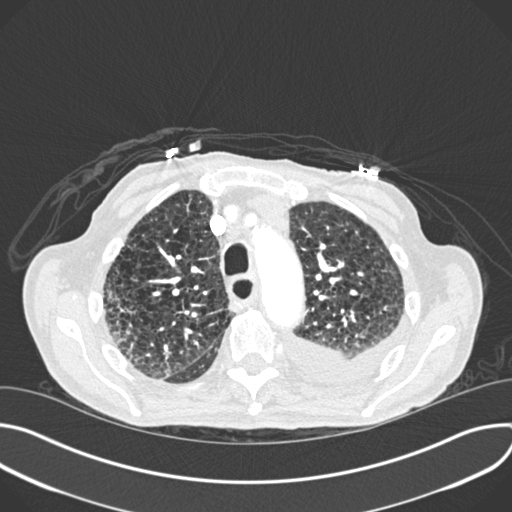
[im 215/298  mediastinal]
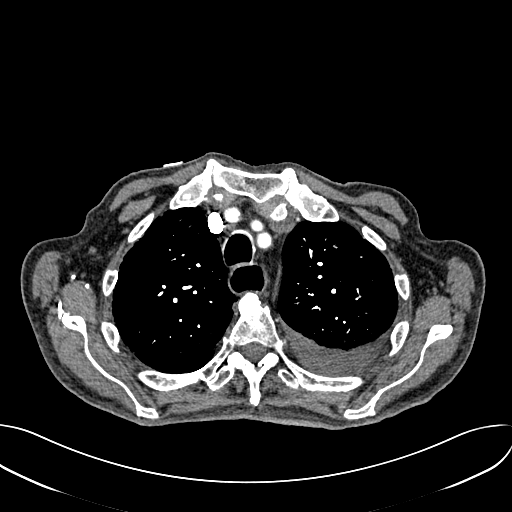
[im 232/298  lung]
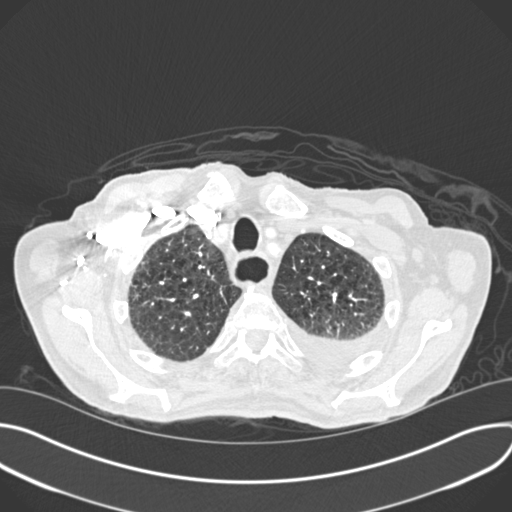
[im 248/298  mediastinal]
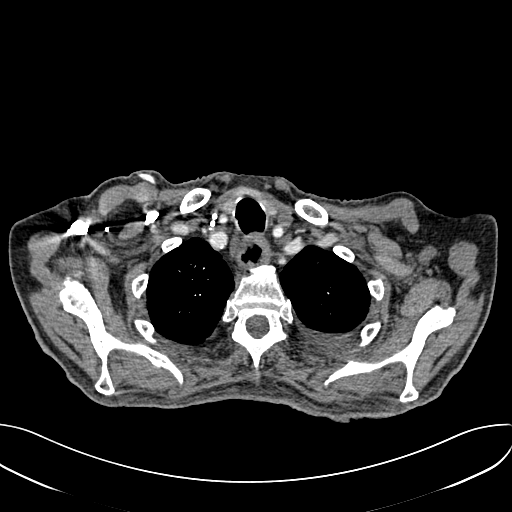
[im 265/298  lung]
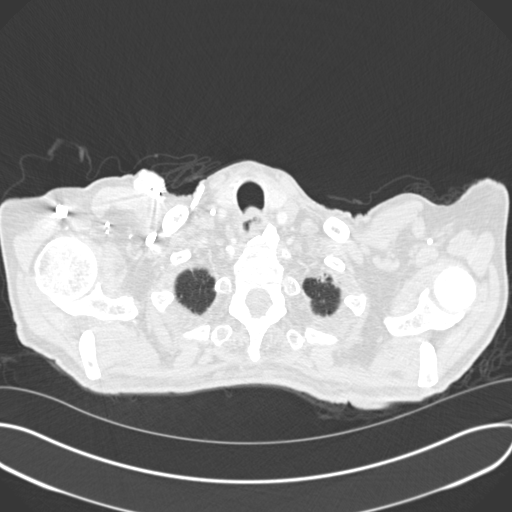
[im 281/298  mediastinal]
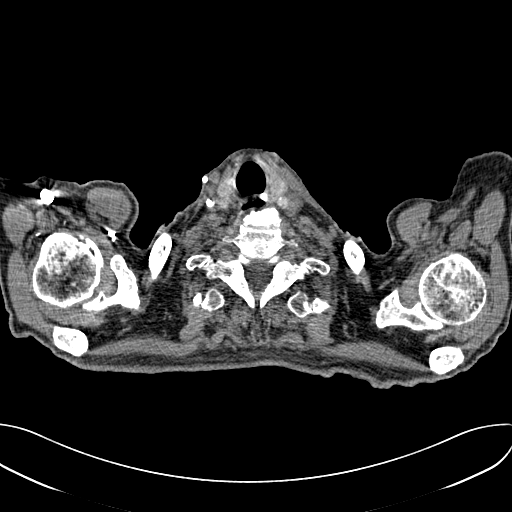

[18 of 30 positions shown; findings below may reference images not displayed]

FINDINGS: There are no filling defects in the pulmonary arterial tree to
suggest acute pulmonary thromboembolism.

Precarinal lymph node is 7 mm and has markedly reduced in size.
Additional precarinal lymph node on image 65 is 10 mm and L is also
reduced in size. Sub carinal lymph node is 13 mm and was previously
23 mm based on my measurements. Soft tissue prominence of the right
hilum has improved. Prevascular nodes are smaller.

Interstitial lung disease within the right upper lobe characterized
primarily by interlobular septal thickening is again noted. It is
not significantly changed. Background of emphysema is noted. Similar
interstitial opacities in the peripheral right middle lobe are not
significantly changed.

Interstitial changes and confluent consolidation in the left lower
lobe has increased since the prior study.

A left pleural effusion has increased in size. It is somewhat
loculated as it is lobulated and somewhat partition did.

Low-density areas in the spleen are present. There is an area of
wedge-shaped low-density. On a previous PET-CT, the spleen was noted
to be hypermetabolic. The low-density area in the spleen is
inseparable from the hemidiaphragm. Invasion by tumor is not
excluded.

No vertebral compression deformity.

No pneumothorax.

Review of the MIP images confirms the above findings.
IMPRESSION: No evidence of acute pulmonary thromboembolism.

Improve mediastinal adenopathy

Left pleural effusion has enlarged and now is loculated.

Pulmonary parenchymal disease in the left lower lobe has progressed.
This may represent tumor infiltration. Similar findings in the right
lung are stable.

Low-density area in the spleen is present worrisome for tumor. It is
inseparable from the left hemidiaphragm and tumor invasion into the
hemidiaphragm is not excluded.

## 2016-08-03 IMAGING — CR DG CHEST 1V
1 series · 1 of 1 positions shown · non-contrast
Comparison: Plain film 08/09/2014, CT 08/09/2014

CLINICAL DATA: [AGE] male with a history of new onset
shortness of breath. History of non-Hodgkin's lymphoma

EXAM:
CHEST  1 VIEW

[ap]
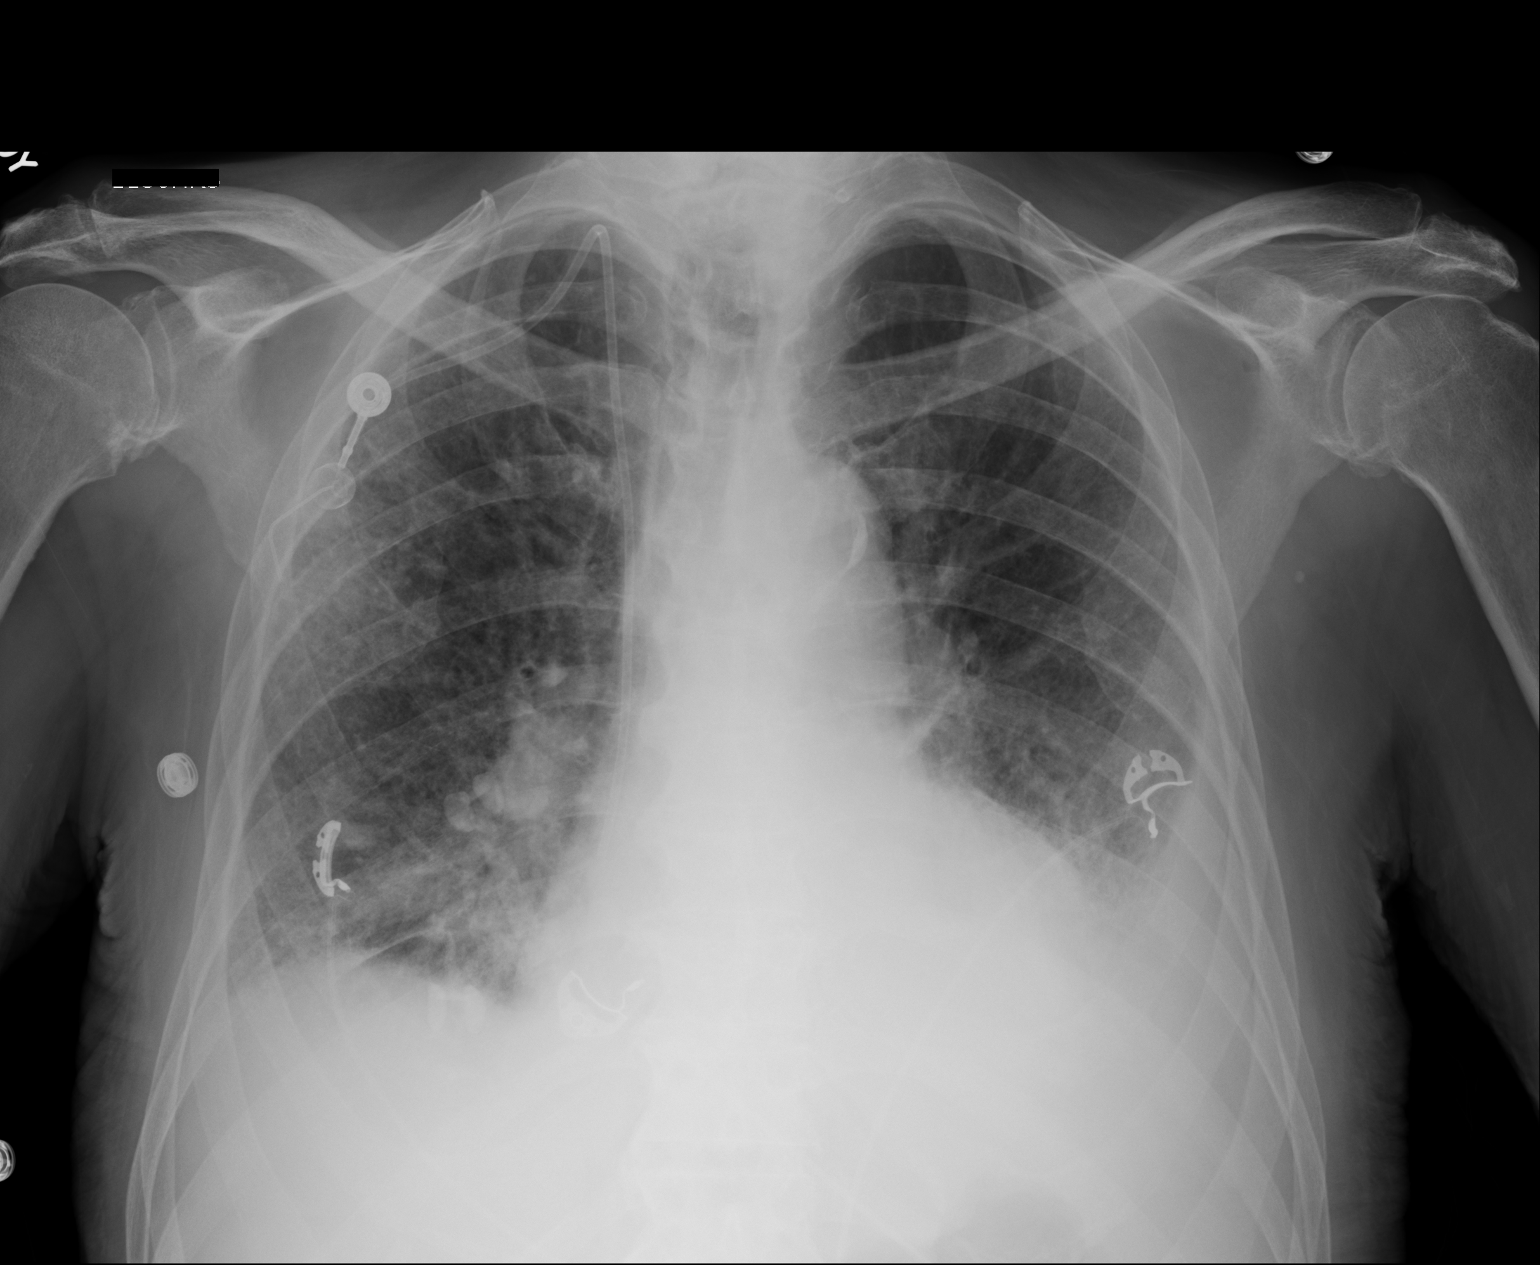

[1 of 1 positions shown; findings below may reference images not displayed]

FINDINGS: Cardiomediastinal silhouette unchanged in size and contour. Borders
are somewhat obscured by overlying lung/ pleural disease.

Lung volumes are decreased from the prior with developing airspace
disease at the bilateral lung bases. Interval opacification of the
left hemidiaphragm and left costophrenic angle.

Unchanged appearance of port catheter on the right chest wall from
right IJ approach. The tip of the catheter appears to terminate at
the superior cavoatrial junction.

Atherosclerosis.
IMPRESSION: Interval development of left-sided pleural effusion, with worsening
airspace disease at the lung bases, potentially edema or
consolidation.

Unchanged right IJ port catheter.
